# Patient Record
Sex: Male | Born: 1960 | Race: White | Hispanic: No | Smoking: Never smoker
Health system: Southern US, Community
[De-identification: ages and names within clinical notes are randomized; demographics above are authoritative.]

## PROBLEM LIST (undated history)

## (undated) DIAGNOSIS — I639 Cerebral infarction, unspecified: Secondary | ICD-10-CM

## (undated) DIAGNOSIS — R1310 Dysphagia, unspecified: Secondary | ICD-10-CM

## (undated) DIAGNOSIS — E7800 Pure hypercholesterolemia, unspecified: Secondary | ICD-10-CM

## (undated) DIAGNOSIS — I509 Heart failure, unspecified: Secondary | ICD-10-CM

## (undated) DIAGNOSIS — I10 Essential (primary) hypertension: Secondary | ICD-10-CM

## (undated) DIAGNOSIS — N289 Disorder of kidney and ureter, unspecified: Secondary | ICD-10-CM

## (undated) DIAGNOSIS — E119 Type 2 diabetes mellitus without complications: Secondary | ICD-10-CM

## (undated) HISTORY — PX: PEG TUBE PLACEMENT: SUR1034

---

## 2016-11-25 ENCOUNTER — Emergency Department (HOSPITAL_COMMUNITY)
Admission: EM | Admit: 2016-11-25 | Discharge: 2016-11-25 | Disposition: A | Discharge status: Home / Self Care | Payer: Medicaid Other | Attending: Emergency Medicine | Admitting: Emergency Medicine

## 2016-11-25 ENCOUNTER — Encounter (HOSPITAL_COMMUNITY): Payer: Self-pay | Admitting: Emergency Medicine

## 2016-11-25 DIAGNOSIS — I509 Heart failure, unspecified: Secondary | ICD-10-CM | POA: Insufficient documentation

## 2016-11-25 DIAGNOSIS — Z79899 Other long term (current) drug therapy: Secondary | ICD-10-CM | POA: Insufficient documentation

## 2016-11-25 DIAGNOSIS — I110 Hypertensive heart disease with heart failure: Secondary | ICD-10-CM | POA: Insufficient documentation

## 2016-11-25 DIAGNOSIS — J069 Acute upper respiratory infection, unspecified: Secondary | ICD-10-CM

## 2016-11-25 DIAGNOSIS — R0981 Nasal congestion: Secondary | ICD-10-CM

## 2016-11-25 DIAGNOSIS — Z7982 Long term (current) use of aspirin: Secondary | ICD-10-CM | POA: Insufficient documentation

## 2016-11-25 DIAGNOSIS — E119 Type 2 diabetes mellitus without complications: Secondary | ICD-10-CM | POA: Insufficient documentation

## 2016-11-25 HISTORY — DX: Pure hypercholesterolemia, unspecified: E78.00

## 2016-11-25 HISTORY — DX: Disorder of kidney and ureter, unspecified: N28.9

## 2016-11-25 HISTORY — DX: Cerebral infarction, unspecified: I63.9

## 2016-11-25 HISTORY — DX: Heart failure, unspecified: I50.9

## 2016-11-25 HISTORY — DX: Essential (primary) hypertension: I10

## 2016-11-25 HISTORY — DX: Type 2 diabetes mellitus without complications: E11.9

## 2016-11-25 MED ORDER — FLUTICASONE PROPIONATE 50 MCG/ACT NA SUSP: 2.0000 | Freq: Every day | NASAL | 0 refills | Status: DC

## 2016-11-25 MED ORDER — CETIRIZINE-PSEUDOEPHEDRINE ER 5-120 MG PO TB12: 1.0000 | ORAL_TABLET | Freq: Every day | ORAL | 0 refills | Status: DC

## 2016-11-25 NOTE — ED Notes (Signed)
Pt and family updated on plan of care,  

## 2016-11-25 NOTE — ED Triage Notes (Signed)
Caregiver states the pt has been short of breath that started today.  Pt is not in any acute distress, resp even and non labored.  Pt states his nose is stopped up and is causing him to have a hard time breathing.

## 2016-11-25 NOTE — ED Notes (Signed)
ED Provider at bedside. 

## 2016-11-25 NOTE — ED Provider Notes (Signed)
[facility]-EM[facility]Y DEPT Provider Note   CSN: [id number] Arrival date & time: 11/25/16  2039   By signing my name below, I, Ronald Ortiz, attest that this documentation has been prepared under the direction and in the presence of [name], MD  Electronically Signed: Clovis Ortiz, ED Scribe. 11/25/16. 11:17 PM.   History   Chief Complaint Chief Complaint  Patient presents with  . Nasal Congestion   The history is provided by a caregiver. No language interpreter was used.   HPI Comments:  Ronald Ortiz is a 56 y.o. male, with a PMHx of CHF, FM, HTN, renal disorder and stroke on 10/19/16, who presents to the Emergency Department complaining of acute onset, gradually worsening sensation of SOB x yesterday. Caregiver also reports nasal congestion which appears clear and mild nose bleeding. His sensation of SOB is worse when laying flat at night. Pt has used nasal spray with no relief. The caregiver has also tried to use a nasal bulb to pump mucus out of the pt's nose with no relief. Caregiver denies fever or any other associated symptoms. No other complaints noted.  No cough or fevers  Past Medical History:  Diagnosis Date  . CHF (congestive heart failure) (HCC)   . Diabetes mellitus without complication (HCC)   . Hypercholesteremia   . Hypertension   . Renal disorder   . Stroke [facility])    feb 14th    There are no active problems to display for this patient.   Past Surgical History:  Procedure Laterality Date  . PEG TUBE PLACEMENT      Home Medications    Prior to Admission medications   Medication Sig Start Date End Date Taking? Authorizing Provider  amLODipine (NORVASC) 10 MG tablet Place 10 mg into feeding tube daily.   Yes Historical Provider, MD  aspirin 325 MG tablet Place 325 mg into feeding tube daily.   Yes Historical Provider, MD  atorvastatin (LIPITOR) 80 MG tablet Place 80 mg into feeding tube at bedtime.   Yes Historical Provider, MD  clopidogrel (PLAVIX) 75 MG  tablet Place 75 mg into feeding tube daily.   Yes Historical Provider, MD  docusate sodium (COLACE) 100 MG capsule 100 mg 2 (two) times daily as needed for mild constipation. Pt takes via peg tube.   Yes Historical Provider, MD  labetalol (NORMODYNE) 200 MG tablet Place 400 mg into feeding tube 2 (two) times daily.   Yes Historical Provider, MD  metoCLOPramide (REGLAN) 5 MG/5ML solution Place 5 mg into feeding tube 4 (four) times daily.   Yes Historical Provider, MD  phenylephrine (NEO-SYNEPHRINE) 0.5 % nasal solution Place 1 drop into both nostrils every 8 (eight) hours as needed (for epistaxis).   Yes Historical Provider, MD  polyvinyl alcohol (LIQUIFILM TEARS) 1.4 % ophthalmic solution Place 1 drop into both eyes every 6 (six) hours.   Yes Historical Provider, MD  potassium chloride (KLOR-CON) 20 MEQ packet Place 20 mEq into feeding tube daily.   Yes Historical Provider, MD  predniSONE (DELTASONE) 20 MG tablet Place 20 mg into feeding tube daily with breakfast.   Yes Historical Provider, MD  zolpidem (AMBIEN) 5 MG tablet Place 5 mg into feeding tube at bedtime as needed for sleep.   Yes Historical Provider, MD  cetirizine-pseudoephedrine (ZYRTEC-D) 5-120 MG tablet Take 1 tablet by mouth daily. 11/25/16   [name], MD  fluticasone (FLONASE) 50 MCG/ACT nasal spray Place 2 sprays into both nostrils daily. 11/25/16 12/02/16  [name], MD  Family History No family history on file.  Social History Social History  Substance Use Topics  . Smoking status: Never Smoker  . Smokeless tobacco: Never Used  . Alcohol use No     Allergies   Patient has no known allergies.   Review of Systems Review of Systems  Constitutional: Negative for fever.  HENT: Positive for congestion.   Respiratory: Positive for shortness of breath.   All other systems reviewed and are negative.    Physical Exam Updated Vital Signs BP 117/87   Pulse 80   Temp 98.1 F (36.7 C) (Oral)   Resp 16   Ht  6' (1.829 m)   Wt 165 lb (74.8 kg)   SpO2 100%   BMI 22.38 kg/m   Physical Exam  Constitutional: He appears well-developed and well-nourished.  HENT:  Head: Normocephalic and atraumatic.  Right Ear: External ear normal.  Left Ear: External ear normal.  Nose: Nose normal.  Bilateral nares are without significant congestion at this time. Mild areas of scabs to the right nare without active bleeding.   Eyes: Right eye exhibits no discharge. Left eye exhibits no discharge.  Chronic right eye changes  Neck: Neck supple.  Cardiovascular: Normal rate, regular rhythm and normal heart sounds.   Pulmonary/Chest: Effort normal and breath sounds normal. He has no wheezes. He has no rales.  Musculoskeletal: He exhibits no edema.  Neurological: He is alert.  Skin: Skin is warm and dry.  Nursing note and vitals reviewed.  ED Treatments / Results  DIAGNOSTIC STUDIES:  Oxygen Saturation is 100% on RA, normal by my interpretation.    COORDINATION OF CARE:  11:10 PM Discussed treatment plan with pt at bedside and pt agreed to plan.  Labs (all labs ordered are listed, but only abnormal results are displayed) Labs Reviewed - No data to display  EKG  EKG Interpretation None       Radiology No results found.  Procedures Procedures (including critical care time)  Medications Ordered in ED Medications - No data to display   Initial Impression / Assessment and Plan / ED Course  I have reviewed the triage vital signs and the nursing notes.  Pertinent labs & imaging results that were available during my care of the patient were reviewed by me and considered in my medical decision making (see chart for details).     Patient's chief complaint is with nasal congestion. He has no increased work of breathing. He has "trouble breathing" that is really trouble breathing through his nose only. No cough. Lungs are clear. Discussed over-the-counter remedies as well as will prescribe  antihistamines. Discussed return precautions. Overall appears at baseline. Advised to sleep more upright to help with difficulty breathing at night due to the congestion.  Final Clinical Impressions(s) / ED Diagnoses   Final diagnoses:  Nasal congestion  Viral upper respiratory tract infection    New Prescriptions Discharge Medication List as of 11/25/2016 11:30 PM    START taking these medications   Details  cetirizine-pseudoephedrine (ZYRTEC-D) 5-120 MG tablet Take 1 tablet by mouth daily., Starting Fri 11/25/2016, Print    fluticasone (FLONASE) 50 MCG/ACT nasal spray Place 2 sprays into both nostrils daily., Starting Fri 11/25/2016, Until Fri 12/02/2016, Print       I personally performed the services described in this documentation, which was scribed in my presence. The recorded information has been reviewed and is accurate.     Pricilla LovelessScott Benaiah Behan, MD 11/26/16 [id number]

## 2016-11-25 NOTE — ED Triage Notes (Signed)
[place]- now cared for by his brother-   Pt has no physician  He complains of dyspnea x 24 hours

## 2016-11-25 NOTE — ED Notes (Signed)
Pt c/o hard time breathing through the right nare, pt and brother report that symptoms started last night, denies any cough, denies any problems with aspiration, pt has hx of dysphagia due to previous stroke,

## 2016-11-29 ENCOUNTER — Emergency Department (HOSPITAL_COMMUNITY): Payer: Medicaid Other

## 2016-11-29 ENCOUNTER — Emergency Department (HOSPITAL_COMMUNITY)
Admission: EM | Admit: 2016-11-29 | Discharge: 2016-11-29 | Disposition: A | Discharge status: Home / Self Care | Payer: Medicaid Other | Attending: Emergency Medicine | Admitting: Emergency Medicine

## 2016-11-29 ENCOUNTER — Encounter (HOSPITAL_COMMUNITY): Payer: Self-pay | Admitting: Emergency Medicine

## 2016-11-29 DIAGNOSIS — Z79899 Other long term (current) drug therapy: Secondary | ICD-10-CM | POA: Diagnosis not present

## 2016-11-29 DIAGNOSIS — I110 Hypertensive heart disease with heart failure: Secondary | ICD-10-CM | POA: Diagnosis not present

## 2016-11-29 DIAGNOSIS — Z7982 Long term (current) use of aspirin: Secondary | ICD-10-CM | POA: Diagnosis not present

## 2016-11-29 DIAGNOSIS — E119 Type 2 diabetes mellitus without complications: Secondary | ICD-10-CM | POA: Diagnosis not present

## 2016-11-29 DIAGNOSIS — R410 Disorientation, unspecified: Secondary | ICD-10-CM | POA: Diagnosis not present

## 2016-11-29 DIAGNOSIS — I509 Heart failure, unspecified: Secondary | ICD-10-CM | POA: Insufficient documentation

## 2016-11-29 DIAGNOSIS — R4182 Altered mental status, unspecified: Secondary | ICD-10-CM | POA: Diagnosis present

## 2016-11-29 LAB — LIPASE, BLOOD: LIPASE: 26 U/L (ref 11–51)

## 2016-11-29 LAB — DIFFERENTIAL
Basophils Absolute: 0 10*3/uL (ref 0.0–0.1)
Basophils Relative: 0 %
EOS ABS: 0.2 10*3/uL (ref 0.0–0.7)
Eosinophils Relative: 4 %
Lymphocytes Relative: 17 %
Lymphs Abs: 0.8 10*3/uL (ref 0.7–4.0)
Monocytes Absolute: 0.6 10*3/uL (ref 0.1–1.0)
Monocytes Relative: 14 %
NEUTROS ABS: 2.8 10*3/uL (ref 1.7–7.7)
Neutrophils Relative %: 65 %

## 2016-11-29 LAB — URINALYSIS, ROUTINE W REFLEX MICROSCOPIC
Bilirubin Urine: NEGATIVE
GLUCOSE, UA: NEGATIVE mg/dL
Hgb urine dipstick: NEGATIVE
Ketones, ur: NEGATIVE mg/dL
LEUKOCYTES UA: NEGATIVE
Nitrite: NEGATIVE
PROTEIN: NEGATIVE mg/dL
Specific Gravity, Urine: 1.012 (ref 1.005–1.030)
pH: 6 (ref 5.0–8.0)

## 2016-11-29 LAB — CBC
HCT: 35.1 % — ABNORMAL LOW (ref 39.0–52.0)
Hemoglobin: 12 g/dL — ABNORMAL LOW (ref 13.0–17.0)
MCH: 32.3 pg (ref 26.0–34.0)
MCHC: 34.2 g/dL (ref 30.0–36.0)
MCV: 94.6 fL (ref 78.0–100.0)
PLATELETS: 256 10*3/uL (ref 150–400)
RBC: 3.71 MIL/uL — AB (ref 4.22–5.81)
RDW: 13.9 % (ref 11.5–15.5)
WBC: 4.6 10*3/uL (ref 4.0–10.5)

## 2016-11-29 LAB — COMPREHENSIVE METABOLIC PANEL
ALT: 61 U/L (ref 17–63)
AST: 54 U/L — ABNORMAL HIGH (ref 15–41)
Albumin: 3.7 g/dL (ref 3.5–5.0)
Alkaline Phosphatase: 90 U/L (ref 38–126)
Anion gap: 9 (ref 5–15)
BUN: 24 mg/dL — ABNORMAL HIGH (ref 6–20)
CHLORIDE: 99 mmol/L — AB (ref 101–111)
CO2: 29 mmol/L (ref 22–32)
CREATININE: 1.29 mg/dL — AB (ref 0.61–1.24)
Calcium: 9.8 mg/dL (ref 8.9–10.3)
Glucose, Bld: 201 mg/dL — ABNORMAL HIGH (ref 65–99)
POTASSIUM: 4.1 mmol/L (ref 3.5–5.1)
Sodium: 137 mmol/L (ref 135–145)
Total Bilirubin: 0.8 mg/dL (ref 0.3–1.2)
Total Protein: 7.6 g/dL (ref 6.5–8.1)

## 2016-11-29 LAB — TROPONIN I

## 2016-11-29 LAB — BRAIN NATRIURETIC PEPTIDE: B NATRIURETIC PEPTIDE 5: 12 pg/mL (ref 0.0–100.0)

## 2016-11-29 NOTE — ED Provider Notes (Signed)
[facility] DE[facility] Provider Note   CSN: [id number] Arrival date & time: 11/29/16  1244     History   Chief Complaint Chief Complaint  Patient presents with  . Altered Mental Status    HPI Ronald Ortiz is a 56 y.o. male.  The history is provided by a relative and a caregiver. The history is limited by the condition of the patient (non-verbal).  Altered Mental Status      Pt was seen at 1340.  Per pt's family: Pt's family states pt has "been acting confused" since yesterday. Has been associated with generalized weakness, coughing and "gagging like he's going to throw up." Pt's family states pt has had nose bleeds, but then state pt's nosebleeds start "after he shoves his fingers up his nose" and "he picks his nose a lot." Pt was evaluated in the ED 4 days ago for SOB and nasal congestion, was rx nasal sprays but did not fill the prescriptions. Caregivers deny pt has fallen, no vomiting/diarrhea, no fevers. Pt has hx of recent CVA and is non-verbal, mild left sided weakness, unable to stand without significant assist, and bed bound at baseline. Family states they "just picked him up" from [facility] in [place].   Past Medical History:  Diagnosis Date  . CHF (congestive heart failure) (HCC)   . Diabetes mellitus without complication (HCC)   . Hypercholesteremia   . Hypertension   . Renal disorder   . Stroke [facility])    feb 14th    There are no active problems to display for this patient.   Past Surgical History:  Procedure Laterality Date  . PEG TUBE PLACEMENT         Home Medications    Prior to Admission medications   Medication Sig Start Date End Date Taking? Authorizing Provider  amLODipine (NORVASC) 10 MG tablet Place 10 mg into feeding tube daily.   Yes Historical Provider, MD  aspirin 325 MG tablet Place 325 mg into feeding tube daily.   Yes Historical Provider, MD  atorvastatin (LIPITOR) 80 MG tablet Place 80 mg into feeding tube at bedtime.    Yes Historical Provider, MD  cetirizine-pseudoephedrine (ZYRTEC-D) 5-120 MG tablet Take 1 tablet by mouth daily. 11/25/16  Yes [name], MD  clopidogrel (PLAVIX) 75 MG tablet Place 75 mg into feeding tube daily.   Yes Historical Provider, MD  docusate sodium (COLACE) 100 MG capsule 100 mg 2 (two) times daily as needed for mild constipation. Pt takes via peg tube.   Yes Historical Provider, MD  fluticasone (FLONASE) 50 MCG/ACT nasal spray Place 2 sprays into both nostrils daily. 11/25/16 12/02/16 Yes [name], MD  labetalol (NORMODYNE) 200 MG tablet Place 400 mg into feeding tube 2 (two) times daily.   Yes Historical Provider, MD  metoCLOPramide (REGLAN) 5 MG/5ML solution Place 5 mg into feeding tube 4 (four) times daily.   Yes Historical Provider, MD  polyvinyl alcohol (LIQUIFILM TEARS) 1.4 % ophthalmic solution Place 1 drop into both eyes every 6 (six) hours.   Yes Historical Provider, MD  potassium chloride (KLOR-CON) 20 MEQ packet Place 20 mEq into feeding tube daily.   Yes Historical Provider, MD  zolpidem (AMBIEN) 5 MG tablet Place 5 mg into feeding tube at bedtime as needed for sleep.   Yes Historical Provider, MD  phenylephrine (NEO-SYNEPHRINE) 0.5 % nasal solution Place 1 drop into both nostrils every 8 (eight) hours as needed (for epistaxis).    Historical Provider, MD  predniSONE (DELTASONE)  20 MG tablet Place 20 mg into feeding tube daily with breakfast.    Historical Provider, MD    Family History History reviewed. No pertinent family history.  Social History Social History  Substance Use Topics  . Smoking status: Never Smoker  . Smokeless tobacco: Never Used  . Alcohol use No     Allergies   Patient has no known allergies.   Review of Systems Review of Systems  Unable to perform ROS: Patient nonverbal     Physical Exam Updated Vital Signs BP 125/85 (BP Location: Left Arm)   Pulse 80   Temp 98.5 F (36.9 C)   Resp 20   Ht 6' (1.829 m)   Wt 165 lb (74.8  kg)   SpO2 98%   BMI 22.38 kg/m   Physical Exam 1345: Physical examination:  Nursing notes reviewed; Vital signs and O2 SAT reviewed;  Constitutional: Well developed, Well nourished, Well hydrated, In no acute distress; Head:  Normocephalic, atraumatic; Eyes: Chronic right eye changes. Otherwise, EOMI, No scleral icterus; ENMT: +dried blood right nares, no active bleeding. Mouth and pharynx normal, Mucous membranes moist; Neck: Supple, Full range of motion, No lymphadenopathy; Cardiovascular: Regular rate and rhythm, No gallop; Respiratory: Breath sounds clear & equal bilaterally, No wheezes. Normal respiratory effort/excursion; Chest: Nontender, Movement normal; Abdomen: Soft, Nontender, Nondistended, Normal bowel sounds. +PEG tube in place, no drainage, no erythema.; Genitourinary: No CVA tenderness; Extremities: Pulses normal, No deformity. No edema, No calf edema or asymmetry.; Neuro: Awake, alert. Non-verbal and mild left sided weakness per hx..; Skin: Color normal, Warm, Dry.   ED Treatments / Results  Labs (all labs ordered are listed, but only abnormal results are displayed)   EKG  EKG Interpretation  Date/Time:  Tuesday November 29 2016 13:33:22 EDT Ventricular Rate:  83 PR Interval:    QRS Duration: 106 QT Interval:  393 QTC Calculation: 462 R Axis:   81 Text Interpretation:  Sinus rhythm Baseline wander in lead(s) V2 V3 V4 V5 No old tracing to compare Confirmed by KNAPP  MD-J, JON ([id number]) on 11/29/2016 1:41:23 PM       Radiology   Procedures Procedures (including critical care time)  Medications Ordered in ED Medications - No data to display   Initial Impression / Assessment and Plan / ED Course  I have reviewed the triage vital signs and the nursing notes.  Pertinent labs & imaging results that were available during my care of the patient were reviewed by me and considered in my medical decision making (see chart for details).  MDM Reviewed: nursing note and  vitals Interpretation: labs, ECG, x-ray and CT scan   Results for orders placed or performed during the hospital encounter of 11/29/16  Comprehensive metabolic panel  Result Value Ref Range   Sodium 137 135 - 145 mmol/L   Potassium 4.1 3.5 - 5.1 mmol/L   Chloride 99 (L) 101 - 111 mmol/L   CO2 29 22 - 32 mmol/L   Glucose, Bld 201 (H) 65 - 99 mg/dL   BUN 24 (H) 6 - 20 mg/dL   Creatinine, Ser 1.32 (H) 0.61 - 1.24 mg/dL   Calcium 9.8 8.9 - 44.0 mg/dL   Total Protein 7.6 6.5 - 8.1 g/dL   Albumin 3.7 3.5 - 5.0 g/dL   AST 54 (H) 15 - 41 U/L   ALT 61 17 - 63 U/L   Alkaline Phosphatase 90 38 - 126 U/L   Total Bilirubin 0.8 0.3 - 1.2 mg/dL   GFR  calc non Af Amer >60 >60 mL/min   GFR calc Af Amer >60 >60 mL/min   Anion gap 9 5 - 15  CBC  Result Value Ref Range   WBC 4.6 4.0 - 10.5 K/uL   RBC 3.71 (L) 4.22 - 5.81 MIL/uL   Hemoglobin 12.0 (L) 13.0 - 17.0 g/dL   HCT 16.135.1 (L) 09.639.0 - 04.552.0 %   MCV 94.6 78.0 - 100.0 fL   MCH 32.3 26.0 - 34.0 pg   MCHC 34.2 30.0 - 36.0 g/dL   RDW 40.913.9 81.111.5 - 91.415.5 %   Platelets 256 150 - 400 K/uL  Urinalysis, Routine w reflex microscopic  Result Value Ref Range   Color, Urine YELLOW YELLOW   APPearance CLEAR CLEAR   Specific Gravity, Urine 1.012 1.005 - 1.030   pH 6.0 5.0 - 8.0   Glucose, UA NEGATIVE NEGATIVE mg/dL   Hgb urine dipstick NEGATIVE NEGATIVE   Bilirubin Urine NEGATIVE NEGATIVE   Ketones, ur NEGATIVE NEGATIVE mg/dL   Protein, ur NEGATIVE NEGATIVE mg/dL   Nitrite NEGATIVE NEGATIVE   Leukocytes, UA NEGATIVE NEGATIVE  Lipase, blood  Result Value Ref Range   Lipase 26 11 - 51 U/L  Troponin I  Result Value Ref Range   Troponin I <0.03 <0.03 ng/mL  Brain natriuretic peptide  Result Value Ref Range   B Natriuretic Peptide 12.0 0.0 - 100.0 pg/mL  Differential  Result Value Ref Range   Neutrophils Relative % 65 %   Neutro Abs 2.8 1.7 - 7.7 K/uL   Lymphocytes Relative 17 %   Lymphs Abs 0.8 0.7 - 4.0 K/uL   Monocytes Relative 14 %    Monocytes Absolute 0.6 0.1 - 1.0 K/uL   Eosinophils Relative 4 %   Eosinophils Absolute 0.2 0.0 - 0.7 K/uL   Basophils Relative 0 %   Basophils Absolute 0.0 0.0 - 0.1 K/uL   Dg Abd Acute W/chest Result Date: 11/29/2016 CLINICAL DATA:  Cough and congestion EXAM: DG ABDOMEN ACUTE W/ 1V CHEST COMPARISON:  None. FINDINGS: Cardiac shadow is at the upper limits of normal in size. The lungs are well aerated bilaterally. Gastrostomy catheter is noted in the left upper quadrant. Scattered large and small bowel gas is noted. Degenerative changes of the lumbar spine are seen. No abnormal calcifications are noted. IMPRESSION: No acute abnormality noted. Electronically Signed   By: Alcide CleverMark  Lukens M.D.   On: 11/29/2016 14:45   Ct Head Wo Contrast Result Date: 11/29/2016 CLINICAL DATA:  Generalized weakness and lethargy. EXAM: CT HEAD WITHOUT CONTRAST TECHNIQUE: Contiguous axial images were obtained from the base of the skull through the vertex without intravenous contrast. COMPARISON:  None. FINDINGS: Brain: No acute intracranial hemorrhage. No focal mass lesion. No CT evidence of acute infarction. No midline shift or mass effect. No hydrocephalus. Basilar cisterns are patent. Deep white matter infarction on the RIGHT centrum semiovale and in the bilateral frontal lobes. Additional remote infarction in the RIGHT thalamus. Vascular: No hyperdense vessel or unexpected calcification. Skull: Normal. Negative for fracture or focal lesion. Sinuses/Orbits: Paranasal sinuses and mastoid air cells are clear. Orbits are clear. Other: None. IMPRESSION: 1. No acute intracranial findings. 2. Deep white matter infarctions. Electronically Signed   By: Genevive BiStewart  Edmunds M.D.   On: 11/29/2016 15:26    1545:  Pt's family seem overwhelmed with amount of care pt requires.  [facility], case discussed, including:  HPI, pertinent PM/SHx, VS/PE, dx testing, ED course and treatment:  Agreeable to come to  ED to speak with family. MRI  brain pending to r/o new stroke as cause for symptoms. Otherwise, no clear indication for admission at this time. Sign out to Dr. Rubin Payor.    Final Clinical Impressions(s) / ED Diagnoses   Final diagnoses:  None    New Prescriptions New Prescriptions   No medications on file      Samuel Jester, DO 11/29/16 1546

## 2016-11-29 NOTE — ED Notes (Signed)
Unable to do orthostatics.  Pt unable to stand per sister.

## 2016-11-29 NOTE — Discharge Instructions (Signed)
Follow up with the resources and with his new primary care doctor.

## 2016-11-29 NOTE — ED Triage Notes (Signed)
PT Brother brought pt in today because they noticed increased generalized weakness with lethargy at home starting yesterday morning. PT is non-verbal and has a peg-tube and is NPO d/t old Strokes. PT c/o nausea and upper abdominal discomfort, nasal congestion, mucous around peg tube insertion. Last BM 11/28/16. PT brother states pt was seen in ED 11/25/16 and they were unable to get the prescriptions filled d/t financial issues and pt does not have insurance yet.

## 2016-11-29 NOTE — Care Management (Signed)
CM consult received for resources. 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 His brother Vincenza Hews(Shane) and sister in law have been his primary caregivers. He uses a WC for mobility and has PEG tub for nutrition. Vincenza HewsShane and his wife state that physically they can care for him but financially they are struggling with price of tube feed and meds. Vincenza HewsShane has applied for medicaid last week and is waiting to hear back. He has applied for disability. His brother has [facility] and his first appointment is on 12/16/16. Pt given contact info fro [facility] for further community resources. They have declined HH or [facility].

## 2016-11-29 NOTE — ED Provider Notes (Signed)
  Physical Exam  BP 130/82   Pulse 83   Temp 98.5 F (36.9 C)   Resp 15   Ht 6' (1.829 m)   Wt 165 lb (74.8 kg)   SpO2 98%   BMI 22.38 kg/m   Physical Exam  ED Course  Procedures  MDM MRI reviewed. No acute changes. Discussed with patient and family. Will discharge home. Has been seen by case management.       Ronald CoreNathan Newell Frater, MD 11/29/16 [phone]

## 2016-11-30 LAB — URINE CULTURE: CULTURE: NO GROWTH

## 2017-02-15 ENCOUNTER — Inpatient Hospital Stay (HOSPITAL_COMMUNITY)
Admission: EM | Admit: 2017-02-15 | Discharge: 2017-02-18 | DRG: 640 | Disposition: A | Discharge status: Skilled Nursing Facility | Payer: Medicaid Other | Attending: Internal Medicine | Admitting: Internal Medicine

## 2017-02-15 ENCOUNTER — Encounter (HOSPITAL_COMMUNITY): Payer: Self-pay

## 2017-02-15 DIAGNOSIS — Z7902 Long term (current) use of antithrombotics/antiplatelets: Secondary | ICD-10-CM

## 2017-02-15 DIAGNOSIS — Z7189 Other specified counseling: Secondary | ICD-10-CM

## 2017-02-15 DIAGNOSIS — I509 Heart failure, unspecified: Secondary | ICD-10-CM | POA: Diagnosis present

## 2017-02-15 DIAGNOSIS — E860 Dehydration: Secondary | ICD-10-CM

## 2017-02-15 DIAGNOSIS — R5381 Other malaise: Secondary | ICD-10-CM

## 2017-02-15 DIAGNOSIS — E876 Hypokalemia: Secondary | ICD-10-CM | POA: Diagnosis present

## 2017-02-15 DIAGNOSIS — E7800 Pure hypercholesterolemia, unspecified: Secondary | ICD-10-CM | POA: Diagnosis present

## 2017-02-15 DIAGNOSIS — I110 Hypertensive heart disease with heart failure: Secondary | ICD-10-CM | POA: Diagnosis present

## 2017-02-15 DIAGNOSIS — I639 Cerebral infarction, unspecified: Secondary | ICD-10-CM | POA: Diagnosis present

## 2017-02-15 DIAGNOSIS — R824 Acetonuria: Secondary | ICD-10-CM | POA: Diagnosis present

## 2017-02-15 DIAGNOSIS — Z681 Body mass index (BMI) 19 or less, adult: Secondary | ICD-10-CM

## 2017-02-15 DIAGNOSIS — R000 Tachycardia, unspecified: Secondary | ICD-10-CM | POA: Diagnosis present

## 2017-02-15 DIAGNOSIS — Z7982 Long term (current) use of aspirin: Secondary | ICD-10-CM

## 2017-02-15 DIAGNOSIS — I69354 Hemiplegia and hemiparesis following cerebral infarction affecting left non-dominant side: Secondary | ICD-10-CM

## 2017-02-15 DIAGNOSIS — R627 Adult failure to thrive: Principal | ICD-10-CM

## 2017-02-15 DIAGNOSIS — Z7401 Bed confinement status: Secondary | ICD-10-CM

## 2017-02-15 DIAGNOSIS — E43 Unspecified severe protein-calorie malnutrition: Secondary | ICD-10-CM | POA: Diagnosis present

## 2017-02-15 DIAGNOSIS — I10 Essential (primary) hypertension: Secondary | ICD-10-CM | POA: Diagnosis present

## 2017-02-15 DIAGNOSIS — R54 Age-related physical debility: Secondary | ICD-10-CM | POA: Diagnosis present

## 2017-02-15 DIAGNOSIS — R64 Cachexia: Secondary | ICD-10-CM | POA: Diagnosis present

## 2017-02-15 DIAGNOSIS — Z515 Encounter for palliative care: Secondary | ICD-10-CM | POA: Diagnosis present

## 2017-02-15 DIAGNOSIS — Z79899 Other long term (current) drug therapy: Secondary | ICD-10-CM

## 2017-02-15 DIAGNOSIS — R1310 Dysphagia, unspecified: Secondary | ICD-10-CM | POA: Diagnosis present

## 2017-02-15 DIAGNOSIS — E869 Volume depletion, unspecified: Secondary | ICD-10-CM | POA: Diagnosis present

## 2017-02-15 DIAGNOSIS — H5440 Blindness, one eye, unspecified eye: Secondary | ICD-10-CM | POA: Diagnosis present

## 2017-02-15 DIAGNOSIS — R4702 Dysphasia: Secondary | ICD-10-CM | POA: Diagnosis present

## 2017-02-15 DIAGNOSIS — E119 Type 2 diabetes mellitus without complications: Secondary | ICD-10-CM | POA: Diagnosis present

## 2017-02-15 DIAGNOSIS — Z931 Gastrostomy status: Secondary | ICD-10-CM

## 2017-02-15 LAB — URINALYSIS, ROUTINE W REFLEX MICROSCOPIC
Bilirubin Urine: NEGATIVE
Glucose, UA: NEGATIVE mg/dL
Hgb urine dipstick: NEGATIVE
Ketones, ur: 20 mg/dL — AB
Leukocytes, UA: NEGATIVE
Nitrite: NEGATIVE
Protein, ur: NEGATIVE mg/dL
Specific Gravity, Urine: 1.025 (ref 1.005–1.030)
pH: 5 (ref 5.0–8.0)

## 2017-02-15 LAB — CBC WITH DIFFERENTIAL/PLATELET
Basophils Absolute: 0 10*3/uL (ref 0.0–0.1)
Basophils Relative: 0 %
Eosinophils Absolute: 0 10*3/uL (ref 0.0–0.7)
Eosinophils Relative: 0 %
HCT: 38.4 % — ABNORMAL LOW (ref 39.0–52.0)
Hemoglobin: 12.7 g/dL — ABNORMAL LOW (ref 13.0–17.0)
Lymphocytes Relative: 8 %
Lymphs Abs: 0.5 10*3/uL — ABNORMAL LOW (ref 0.7–4.0)
MCH: 30.5 pg (ref 26.0–34.0)
MCHC: 33.1 g/dL (ref 30.0–36.0)
MCV: 92.3 fL (ref 78.0–100.0)
Monocytes Absolute: 0.7 10*3/uL (ref 0.1–1.0)
Monocytes Relative: 11 %
Neutro Abs: 5.2 10*3/uL (ref 1.7–7.7)
Neutrophils Relative %: 81 %
Platelets: 288 10*3/uL (ref 150–400)
RBC: 4.16 MIL/uL — ABNORMAL LOW (ref 4.22–5.81)
RDW: 13.2 % (ref 11.5–15.5)
WBC: 6.5 10*3/uL (ref 4.0–10.5)

## 2017-02-15 LAB — BASIC METABOLIC PANEL
Anion gap: 11 (ref 5–15)
BUN: 55 mg/dL — ABNORMAL HIGH (ref 6–20)
CO2: 29 mmol/L (ref 22–32)
Calcium: 10.3 mg/dL (ref 8.9–10.3)
Chloride: 101 mmol/L (ref 101–111)
Creatinine, Ser: 1.35 mg/dL — ABNORMAL HIGH (ref 0.61–1.24)
GFR calc Af Amer: >60 mL/min (ref >60)
GFR calc non Af Amer: 58 mL/min — ABNORMAL LOW (ref >60)
Glucose, Bld: 163 mg/dL — ABNORMAL HIGH (ref 65–99)
Potassium: 4.2 mmol/L (ref 3.5–5.1)
Sodium: 141 mmol/L (ref 135–145)

## 2017-02-15 MED ORDER — GLUCERNA SHAKE PO LIQD: 237.0000 mL | Freq: Three times a day (TID) | ORAL | Status: DC

## 2017-02-15 MED ORDER — AMLODIPINE BESYLATE 5 MG PO TABS
10.0000 mg | ORAL_TABLET | Freq: Every day | ORAL | Status: DC
  Administered 2017-02-16 – 2017-02-18 (×3): 10 mg
  Filled 2017-02-15 (×3): qty 2

## 2017-02-15 MED ORDER — HEPARIN SODIUM (PORCINE) 5000 UNIT/ML IJ SOLN
5000.0000 [IU] | Freq: Three times a day (TID) | INTRAMUSCULAR | Status: DC
  Administered 2017-02-15 – 2017-02-17 (×7): 5000 [IU] via SUBCUTANEOUS
  Filled 2017-02-15 (×7): qty 1

## 2017-02-15 MED ORDER — MIRTAZAPINE 15 MG PO TABS
15.0000 mg | ORAL_TABLET | Freq: Every day | ORAL | Status: DC
  Administered 2017-02-15: 15 mg via ORAL
  Filled 2017-02-15: qty 1

## 2017-02-15 MED ORDER — POLYVINYL ALCOHOL 1.4 % OP SOLN
1.0000 [drp] | Freq: Four times a day (QID) | OPHTHALMIC | Status: DC
  Administered 2017-02-15 – 2017-02-18 (×10): 1 [drp] via OPHTHALMIC
  Filled 2017-02-15 (×3): qty 15

## 2017-02-15 MED ORDER — PHENYLEPHRINE HCL 0.5 % NA SOLN
1.0000 [drp] | Freq: Three times a day (TID) | NASAL | Status: DC | PRN
  Filled 2017-02-15: qty 15

## 2017-02-15 MED ORDER — CHLORHEXIDINE GLUCONATE 0.12 % MT SOLN
15.0000 mL | Freq: Two times a day (BID) | OROMUCOSAL | Status: DC
  Administered 2017-02-15 – 2017-02-18 (×6): 15 mL via OROMUCOSAL
  Filled 2017-02-15 (×6): qty 15

## 2017-02-15 MED ORDER — ORAL CARE MOUTH RINSE
15.0000 mL | Freq: Two times a day (BID) | OROMUCOSAL | Status: DC
  Administered 2017-02-16 – 2017-02-17 (×3): 15 mL via OROMUCOSAL

## 2017-02-15 MED ORDER — CLOPIDOGREL BISULFATE 75 MG PO TABS
75.0000 mg | ORAL_TABLET | Freq: Every day | ORAL | Status: DC
  Administered 2017-02-16 – 2017-02-18 (×3): 75 mg
  Filled 2017-02-15 (×3): qty 1

## 2017-02-15 MED ORDER — PNEUMOCOCCAL VAC POLYVALENT 25 MCG/0.5ML IJ INJ: 0.5000 mL | INJECTION | INTRAMUSCULAR | Status: DC

## 2017-02-15 MED ORDER — SODIUM CHLORIDE 0.9 % IV BOLUS (SEPSIS)
1000.0000 mL | Freq: Once | INTRAVENOUS | Status: AC
  Administered 2017-02-15: 1000 mL via INTRAVENOUS

## 2017-02-15 MED ORDER — FAMOTIDINE 20 MG PO TABS
20.0000 mg | ORAL_TABLET | Freq: Two times a day (BID) | ORAL | Status: DC
  Administered 2017-02-15: 20 mg via ORAL
  Filled 2017-02-15: qty 1

## 2017-02-15 MED ORDER — GLUCERNA SHAKE PO LIQD
237.0000 mL | Freq: Three times a day (TID) | ORAL | Status: DC
  Administered 2017-02-15 – 2017-02-16 (×3): 237 mL

## 2017-02-15 MED ORDER — ASPIRIN 325 MG PO TABS
325.0000 mg | ORAL_TABLET | Freq: Every day | ORAL | Status: DC
  Administered 2017-02-16 – 2017-02-18 (×3): 325 mg
  Filled 2017-02-15 (×3): qty 1

## 2017-02-15 MED ORDER — ATORVASTATIN CALCIUM 40 MG PO TABS
80.0000 mg | ORAL_TABLET | Freq: Every day | ORAL | Status: DC
  Administered 2017-02-15 – 2017-02-17 (×3): 80 mg
  Filled 2017-02-15 (×3): qty 2

## 2017-02-15 MED ORDER — METOCLOPRAMIDE HCL 5 MG/5ML PO SOLN
5.0000 mg | Freq: Four times a day (QID) | ORAL | Status: DC
  Administered 2017-02-15 – 2017-02-18 (×10): 5 mg
  Filled 2017-02-15 (×10): qty 10

## 2017-02-15 MED ORDER — SODIUM CHLORIDE 0.45 % IV SOLN
INTRAVENOUS | Status: DC
  Administered 2017-02-15 – 2017-02-18 (×5): via INTRAVENOUS

## 2017-02-15 MED ORDER — LABETALOL HCL 200 MG PO TABS
400.0000 mg | ORAL_TABLET | Freq: Two times a day (BID) | ORAL | Status: DC
  Administered 2017-02-15 – 2017-02-18 (×6): 400 mg
  Filled 2017-02-15 (×6): qty 2

## 2017-02-15 NOTE — H&P (Signed)
History and Physical    [name]:[id number] DOB: 22-Feb-1961 DOA: 02/15/2017  PCP: Patient, No Pcp Per   Patient coming from: Home.  I have personally briefly reviewed patient's old medical records in [facility]  Chief Complaint: Weight loss and placement needs.  HPI: Ronald Ortiz is a 56 y.o. 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 Family members state that they are currently indigent and unable to even pay for their utilities. They mentioned that they call the department of social services today and were told to bring the patient to the emergency department for evaluation and placement.   ED Course: Initial vital signs show temperature 97.42F, pulse of 105 BPM, respirations 16 RPN, blood pressure 101/66 mmHg an O2 sat of 97% on room air. Urinalysis shows ketonuria, but is otherwise unremarkable. WBC 6.5 with a normal differential, hemoglobin 12.7 g/dL and platelets 811. Sodium 141, potassium 4.2, chloride 101, bicarbonate 29 mmol/L. BUN was 55, creatinine 1.35 and glucose 163 mg/dL. He received a normal saline 1000 mL bolus with resolution of tachycardia and improvement of blood pressure.   Review of Systems: As per HPI otherwise 10 point review of systems negative.   Past Medical History:  Diagnosis Date  . CHF (congestive heart failure) (HCC)   . Diabetes mellitus without complication (HCC)   .  Hypercholesteremia   . Hypertension   . Renal disorder   . Stroke [facility])    feb 14th    Past Surgical History:  Procedure Laterality Date  . PEG TUBE PLACEMENT       reports that he has never smoked. He has never used smokeless tobacco. He reports that he does not drink alcohol or use drugs.  No Known Allergies  Family History  Problem Relation Age of Onset  . Hypertension Mother     Prior to Admission medications   Medication Sig Start Date End Date Taking? Authorizing Provider  amLODipine (NORVASC) 10 MG tablet Place 10 mg into feeding tube daily.   Yes [provider]  aspirin 325 MG tablet Place 325 mg into feeding tube daily.   Yes [provider]  atorvastatin (LIPITOR) 80 MG tablet Place 80 mg into feeding tube at bedtime.   Yes [provider]  clopidogrel (PLAVIX) 75 MG tablet Place 75 mg into feeding tube daily.   Yes [provider]  docusate sodium (COLACE) 100 MG capsule 100 mg 2 (two) times daily as needed for mild constipation. Pt takes via peg tube.   Yes [provider]  labetalol (NORMODYNE) 200 MG tablet Place 400 mg into feeding tube 2 (two) times daily.   Yes [provider]  metoCLOPramide (REGLAN) 5 MG/5ML solution Place 5 mg into feeding tube 4 (four) times daily.   Yes [provider]  mirtazapine (REMERON) 15 MG tablet Take 15 mg by mouth at bedtime.   Yes [provider]  phenylephrine (NEO-SYNEPHRINE) 0.5 % nasal solution Place 1 drop into both nostrils every 8 (eight) hours as  needed (for epistaxis).   Yes [provider]  polyvinyl alcohol (LIQUIFILM TEARS) 1.4 % ophthalmic solution Place 1 drop into both eyes every 6 (six) hours.   Yes [provider]  ranitidine (ZANTAC) 150 MG tablet Take 150 mg by mouth 2 (two) times daily.   Yes [provider]  fluticasone (FLONASE) 50 MCG/ACT nasal spray Place 2 sprays into both nostrils daily. Patient not taking:  Reported on 02/15/2017 11/25/16 12/02/16  [name], MD    Physical Exam: Vitals:   02/15/17 2030 02/15/17 2100 02/15/17 2130 02/15/17 2213  BP: 109/80 113/82 106/80 117/79  Pulse: 96 92 87 83  Resp:    16  Temp:    98 F (36.7 C)  TempSrc:    Oral  SpO2: 100% 99% 100% 100%  Weight:    59.3 kg (130 lb 11.7 oz)  Height:    6' (1.829 m)    Constitutional: Cachetic, disheveled but otherwise in NAD Eyes: Right corneal opacity and blindness. ENMT: Mucous membranes are dry. Posterior pharynx clear of any exudate or lesions. Dentition is mostly absent and in poor state of repair. Neck: normal, supple, no masses, no thyromegaly Respiratory: Clear to auscultation bilaterally, no wheezing, no crackles. Normal respiratory effort. No accessory muscle use.  Cardiovascular: Regular rate and rhythm, no murmurs / rubs / gallops. No extremity edema. 2+ pedal pulses. No carotid bruits.  Abdomen: PEG in place, soft, no tenderness, no masses palpated. No hepatosplenomegaly. Bowel sounds positive.  Musculoskeletal: No clubbing / cyanosis. Good ROM, no contractures. Normal muscle tone.  Skin: Stage I sacral skin sore with minor surrounding abrasions Neurologic:  Dysarthric speech. Able to answer yes or no questions Decreased sensation on the left. Left-sided hemiparesis 3/5. Psychiatric: Alert and oriented x 2, partially oriented to time and situation.   Labs on Admission: I have personally reviewed following labs and imaging studies  CBC:  Recent Labs Lab 02/15/17 1926  WBC 6.5  NEUTROABS 5.2  HGB 12.7*  HCT 38.4*  MCV 92.3  PLT 288   Basic Metabolic Panel:  Recent Labs Lab 02/15/17 1926  NA 141  K 4.2  CL 101  CO2 29  GLUCOSE 163*  BUN 55*  CREATININE 1.35*  CALCIUM 10.3   GFR: Estimated Creatinine Clearance: 53.1 mL/min (A) (by C-G formula based on SCr of 1.35 mg/dL (H)). Liver Function Tests: No results for input(s): AST, ALT, ALKPHOS, BILITOT, PROT, ALBUMIN in the last  168 hours. No results for input(s): LIPASE, AMYLASE in the last 168 hours. No results for input(s): AMMONIA in the last 168 hours. Coagulation Profile: No results for input(s): INR, PROTIME in the last 168 hours. Cardiac Enzymes: No results for input(s): CKTOTAL, CKMB, CKMBINDEX, TROPONINI in the last 168 hours. BNP (last 3 results) No results for input(s): PROBNP in the last 8760 hours. HbA1C: No results for input(s): HGBA1C in the last 72 hours. CBG: No results for input(s): GLUCAP in the last 168 hours. Lipid Profile: No results for input(s): CHOL, HDL, LDLCALC, TRIG, CHOLHDL, LDLDIRECT in the last 72 hours. Thyroid Function Tests: No results for input(s): TSH, T4TOTAL, FREET4, T3FREE, THYROIDAB in the last 72 hours. Anemia Panel: No results for input(s): VITAMINB12, FOLATE, FERRITIN, TIBC, IRON, RETICCTPCT in the last 72 hours. Urine analysis:    Component Value Date/Time   COLORURINE YELLOW 02/15/2017 1910   APPEARANCEUR CLEAR 02/15/2017 1910   LABSPEC 1.025 02/15/2017 1910   PHURINE 5.0 02/15/2017 1910   GLUCOSEU NEGATIVE 02/15/2017 1910   HGBUR  NEGATIVE 02/15/2017 1910   BILIRUBINUR NEGATIVE 02/15/2017 1910   KETONESUR 20 (A) 02/15/2017 1910   PROTEINUR NEGATIVE 02/15/2017 1910   NITRITE NEGATIVE 02/15/2017 1910   LEUKOCYTESUR NEGATIVE 02/15/2017 1910    Radiological Exams on Admission: No results found.  EKG: Independently reviewed. [name]  Assessment/Plan Principal Problem:   Failure to thrive in adult The patient has been losing weight and deteriorating clinically at home. Family is currently unable to care for the patient. Admit for IV hydration, dietitian, PT and social services evaluation  Active Problems:   Hypertension Continue amlodipine 10 mg per tube daily. Continue labetalol 400 mg per feeding tube twice a day. Monitor blood pressure.    Diabetes mellitus without complication (HCC)  Glucerna 8 ounces per tube times a day. Consult dietitian in  a.m. CBG monitoring while in the hospital.    Hypercholesteremia Continue atorvastatin 80 mg per tube daily. Monitor LFTs and lipid panel yearly.    Stroke [facility](HCC)   Residual left-sided hemiparesis. Supportive care. Consult physical therapy in the morning.    Volume depletion Continue IV hydration with half normal saline infusion. Follow-up renal function and electrolytes in a.m.     DVT prophylaxis: Heparin SQ. Code Status: Full code. Family Communication:  Disposition Plan: Admit for IV hydration, dietitian, PT and social services evaluation. Consults called: Dietitian, physical therapy and social services. Admission status: Observation/MedSurg.   Bobette Moavid Manuel Larson Limones MD [facility] Pager [phone].  If 7PM-7AM, please contact night-coverage [email or web address] Password [email or web address]  02/15/2017, 10:33 PM

## 2017-02-15 NOTE — ED Provider Notes (Signed)
[facility]-EMERGENCY DEPT Provider Note   CSN: [id number] Arrival date & time: 02/15/17  1653  By signing my name below, I, Phillips ClimesFabiola de Louis, attest that this documentation has been prepared under the direction and in the presence of Raeford RazorKohut, Blimy Napoleon, MD . Electronically Signed: Phillips ClimesFabiola de Louis, Scribe. 02/15/2017. 8:14 PM.  History   Chief Complaint Chief Complaint  Patient presents with  . Needs Placement   HPI Comments Ronald Ortiz is a 56 y.o. non-verbal male, with a PMHx significant for CVA with residual left sided weakness, DM, CHF and HTN, who presents to the Emergency Department for assistance with obtaining long-term care placement.  Pt's family states that they can no longer take care of him.  Pt is unable to stand without significant assistance, bed bound at baseline and fed through a feeding tube.  They are not receiving any kind of assistance at this time.  Social services advised them to come to the ED for placement assistance.  They are unable to afford home health services. They cannot even afford their utilities and concerned about their electricity being cut off. They were unable to obtain Medicaid and that pt is currently on a 8 month waiting list for disability insurance.  They state that overall, his sx have worsened since picking him up [facility] [place] [facility] in [place], x4 months ago.   The history is provided by the patient and medical records. No language interpreter was used.   Past Medical History:  Diagnosis Date  . CHF (congestive heart failure) (HCC)   . Diabetes mellitus without complication (HCC)   . Hypercholesteremia   . Hypertension   . Renal disorder   . Stroke [facility](HCC)    feb 14th    There are no active problems to display for this patient.   Past Surgical History:  Procedure Laterality Date  . PEG TUBE PLACEMENT         Home Medications    Prior to Admission medications   Medication Sig Start Date End Date Taking? Authorizing Provider    amLODipine (NORVASC) 10 MG tablet Place 10 mg into feeding tube daily.   Yes [provider]  aspirin 325 MG tablet Place 325 mg into feeding tube daily.   Yes [provider]  atorvastatin (LIPITOR) 80 MG tablet Place 80 mg into feeding tube at bedtime.   Yes [provider]  clopidogrel (PLAVIX) 75 MG tablet Place 75 mg into feeding tube daily.   Yes [provider]  docusate sodium (COLACE) 100 MG capsule 100 mg 2 (two) times daily as needed for mild constipation. Pt takes via peg tube.   Yes [provider]  labetalol (NORMODYNE) 200 MG tablet Place 400 mg into feeding tube 2 (two) times daily.   Yes [provider]  metoCLOPramide (REGLAN) 5 MG/5ML solution Place 5 mg into feeding tube 4 (four) times daily.   Yes [provider]  mirtazapine (REMERON) 15 MG tablet Take 15 mg by mouth at bedtime.   Yes [provider]  phenylephrine (NEO-SYNEPHRINE) 0.5 % nasal solution Place 1 drop into both nostrils every 8 (eight) hours as needed (for epistaxis).   Yes [provider]  polyvinyl alcohol (LIQUIFILM TEARS) 1.4 % ophthalmic solution Place 1 drop into both eyes every 6 (six) hours.   Yes [provider]  ranitidine (ZANTAC) 150 MG tablet Take 150 mg by mouth 2 (two) times daily.   Yes [provider]  fluticasone (FLONASE) 50 MCG/ACT  nasal spray Place 2 sprays into both nostrils daily. Patient not taking: Reported on 02/15/2017 11/25/16 12/02/16  [name], MD    Family History No family history on file.  Social History Social History  Substance Use Topics  . Smoking status: Never Smoker  . Smokeless tobacco: Never Used  . Alcohol use No     Allergies   Patient has no known allergies.  Review of Systems Review of Systems  Unable to perform ROS: Patient nonverbal  Gastrointestinal: Positive for abdominal pain and rectal pain.   Physical Exam Updated Vital Signs BP 101/66 (BP  Location: Left Arm)   Pulse (!) 105   Temp 97.3 F (36.3 C) (Oral)   Resp 16   Ht 5\' 9"  (1.753 m)   Wt 165 lb (74.8 kg)   SpO2 97%   BMI 24.37 kg/m   Physical Exam  Constitutional:  Cachetic.  HENT:  Head: Normocephalic and atraumatic.  Corneal opacity of the right eye.  Eyes: EOM are normal.  Neck: Normal range of motion.  Cardiovascular: Normal rate, regular rhythm, normal heart sounds and intact distal pulses.   Pulmonary/Chest: Effort normal. No respiratory distress.  Coarse breath sounds bilaterally.  Abdominal: Soft.   G-tube in place.  Abdomen soft, non-tender, non-distended.  Neurological: He is alert.  Limited speech.  Dysarthric.  Seems to understand most questions.  Skin: Skin is warm and dry.  Psychiatric: He has a normal mood and affect. Judgment normal.  Nursing note and vitals reviewed.  ED Treatments / Results  DIAGNOSTIC STUDIES: Oxygen Saturation is 97% on room air, normal by my interpretation.    COORDINATION OF CARE: 6:55 PM Discussed placement options with pt's primary caregivers, who are tearful in the room.    Labs (all labs ordered are listed, but only abnormal results are displayed) Labs Reviewed  CBC WITH DIFFERENTIAL/PLATELET - Abnormal; Notable for the following:       Result Value   RBC 4.16 (*)    Hemoglobin 12.7 (*)    HCT 38.4 (*)    Lymphs Abs 0.5 (*)    All other components within normal limits  BASIC METABOLIC PANEL - Abnormal; Notable for the following:    Glucose, Bld 163 (*)    BUN 55 (*)    Creatinine, Ser 1.35 (*)    GFR calc non Af Amer 58 (*)    All other components within normal limits  URINALYSIS, ROUTINE W REFLEX MICROSCOPIC - Abnormal; Notable for the following:    Ketones, ur 20 (*)    All other components within normal limits  CBC - Abnormal; Notable for the following:    RBC 3.54 (*)    Hemoglobin 10.9 (*)    HCT 32.9 (*)    All other components within normal limits  COMPREHENSIVE METABOLIC PANEL - Abnormal;  Notable for the following:    Potassium 3.2 (*)    Glucose, Bld 108 (*)    BUN 48 (*)    Total Protein 6.3 (*)    Albumin 3.4 (*)    All other components within normal limits  BASIC METABOLIC PANEL - Abnormal; Notable for the following:    Potassium 3.4 (*)    BUN 27 (*)    All other components within normal limits  HIV ANTIBODY (ROUTINE TESTING)    EKG  EKG Interpretation None       Radiology No results found.  Procedures Procedures (including critical care time)  Medications Ordered in ED Medications - No data  to display   Initial Impression / Assessment and Plan / ED Course  I have reviewed the triage vital signs and the nursing notes.  Pertinent labs & imaging results that were available during my care of the patient were reviewed by me and considered in my medical decision making (see chart for details).     55yM with failure to thrive. Family both physically and financially take care of him. They called DSS themselves and advised to come to ED.   Final Clinical Impressions(s) / ED Diagnoses   Final diagnoses:  Dehydration  Physical deconditioning  Adult failure to thrive syndrome   I personally preformed the services scribed in my presence. The recorded information has been reviewed is accurate. Raeford Razor, MD.   New Prescriptions New Prescriptions   No medications on file     Raeford Razor, MD 02/18/17 [phone]

## 2017-02-15 NOTE — ED Triage Notes (Signed)
Family brought patient in ED for placement. States they are unable to care for patient at home and called DSS and was told to come to ED to be admitted then placed. Patient has not complaints at this time.

## 2017-02-16 ENCOUNTER — Encounter (HOSPITAL_COMMUNITY): Payer: Self-pay | Admitting: Primary Care

## 2017-02-16 DIAGNOSIS — E869 Volume depletion, unspecified: Secondary | ICD-10-CM | POA: Diagnosis present

## 2017-02-16 DIAGNOSIS — R824 Acetonuria: Secondary | ICD-10-CM | POA: Diagnosis present

## 2017-02-16 DIAGNOSIS — E119 Type 2 diabetes mellitus without complications: Secondary | ICD-10-CM | POA: Diagnosis present

## 2017-02-16 DIAGNOSIS — E860 Dehydration: Secondary | ICD-10-CM | POA: Diagnosis present

## 2017-02-16 DIAGNOSIS — R1310 Dysphagia, unspecified: Secondary | ICD-10-CM | POA: Diagnosis present

## 2017-02-16 DIAGNOSIS — Z79899 Other long term (current) drug therapy: Secondary | ICD-10-CM | POA: Diagnosis not present

## 2017-02-16 DIAGNOSIS — R54 Age-related physical debility: Secondary | ICD-10-CM | POA: Diagnosis present

## 2017-02-16 DIAGNOSIS — Z681 Body mass index (BMI) 19 or less, adult: Secondary | ICD-10-CM | POA: Diagnosis not present

## 2017-02-16 DIAGNOSIS — R64 Cachexia: Secondary | ICD-10-CM | POA: Diagnosis present

## 2017-02-16 DIAGNOSIS — Z931 Gastrostomy status: Secondary | ICD-10-CM | POA: Diagnosis not present

## 2017-02-16 DIAGNOSIS — R000 Tachycardia, unspecified: Secondary | ICD-10-CM | POA: Diagnosis present

## 2017-02-16 DIAGNOSIS — E876 Hypokalemia: Secondary | ICD-10-CM | POA: Diagnosis present

## 2017-02-16 DIAGNOSIS — Z7902 Long term (current) use of antithrombotics/antiplatelets: Secondary | ICD-10-CM | POA: Diagnosis not present

## 2017-02-16 DIAGNOSIS — Z7982 Long term (current) use of aspirin: Secondary | ICD-10-CM | POA: Diagnosis not present

## 2017-02-16 DIAGNOSIS — Z515 Encounter for palliative care: Secondary | ICD-10-CM | POA: Diagnosis present

## 2017-02-16 DIAGNOSIS — I110 Hypertensive heart disease with heart failure: Secondary | ICD-10-CM | POA: Diagnosis present

## 2017-02-16 DIAGNOSIS — I69354 Hemiplegia and hemiparesis following cerebral infarction affecting left non-dominant side: Secondary | ICD-10-CM | POA: Diagnosis not present

## 2017-02-16 DIAGNOSIS — R4702 Dysphasia: Secondary | ICD-10-CM | POA: Diagnosis present

## 2017-02-16 DIAGNOSIS — E7800 Pure hypercholesterolemia, unspecified: Secondary | ICD-10-CM | POA: Diagnosis present

## 2017-02-16 DIAGNOSIS — Z7189 Other specified counseling: Secondary | ICD-10-CM

## 2017-02-16 DIAGNOSIS — R627 Adult failure to thrive: Secondary | ICD-10-CM | POA: Diagnosis present

## 2017-02-16 DIAGNOSIS — I509 Heart failure, unspecified: Secondary | ICD-10-CM | POA: Diagnosis present

## 2017-02-16 DIAGNOSIS — E43 Unspecified severe protein-calorie malnutrition: Secondary | ICD-10-CM | POA: Diagnosis present

## 2017-02-16 DIAGNOSIS — Z7401 Bed confinement status: Secondary | ICD-10-CM | POA: Diagnosis not present

## 2017-02-16 DIAGNOSIS — H5440 Blindness, one eye, unspecified eye: Secondary | ICD-10-CM | POA: Diagnosis present

## 2017-02-16 LAB — CBC
HCT: 32.9 % — ABNORMAL LOW (ref 39.0–52.0)
HEMOGLOBIN: 10.9 g/dL — AB (ref 13.0–17.0)
MCH: 30.8 pg (ref 26.0–34.0)
MCHC: 33.1 g/dL (ref 30.0–36.0)
MCV: 92.9 fL (ref 78.0–100.0)
PLATELETS: 250 10*3/uL (ref 150–400)
RBC: 3.54 MIL/uL — ABNORMAL LOW (ref 4.22–5.81)
RDW: 13.1 % (ref 11.5–15.5)
WBC: 4.8 10*3/uL (ref 4.0–10.5)

## 2017-02-16 LAB — COMPREHENSIVE METABOLIC PANEL
ALBUMIN: 3.4 g/dL — AB (ref 3.5–5.0)
ALK PHOS: 88 U/L (ref 38–126)
ALT: 20 U/L (ref 17–63)
ANION GAP: 8 (ref 5–15)
AST: 20 U/L (ref 15–41)
BUN: 48 mg/dL — ABNORMAL HIGH (ref 6–20)
CHLORIDE: 102 mmol/L (ref 101–111)
CO2: 27 mmol/L (ref 22–32)
Calcium: 8.9 mg/dL (ref 8.9–10.3)
Creatinine, Ser: 0.97 mg/dL (ref 0.61–1.24)
GFR calc Af Amer: >60 mL/min (ref >60)
GFR calc non Af Amer: >60 mL/min (ref >60)
Glucose, Bld: 108 mg/dL — ABNORMAL HIGH (ref 65–99)
POTASSIUM: 3.2 mmol/L — AB (ref 3.5–5.1)
SODIUM: 137 mmol/L (ref 135–145)
Total Bilirubin: 0.8 mg/dL (ref 0.3–1.2)
Total Protein: 6.3 g/dL — ABNORMAL LOW (ref 6.5–8.1)

## 2017-02-16 MED ORDER — OSMOLITE 1.5 CAL PO LIQD
237.0000 mL | Freq: Four times a day (QID) | ORAL | Status: DC
  Administered 2017-02-16: 237 mL
  Filled 2017-02-16 (×4): qty 237

## 2017-02-16 MED ORDER — POTASSIUM CHLORIDE CRYS ER 20 MEQ PO TBCR
40.0000 meq | EXTENDED_RELEASE_TABLET | Freq: Once | ORAL | Status: AC
  Administered 2017-02-16: 40 meq via ORAL
  Filled 2017-02-16: qty 2

## 2017-02-16 MED ORDER — PHENYLEPHRINE HCL 0.25 % NA SOLN
1.0000 | Freq: Three times a day (TID) | NASAL | Status: DC | PRN
  Administered 2017-02-18: 1 via NASAL
  Filled 2017-02-16: qty 15

## 2017-02-16 MED ORDER — MIRTAZAPINE 15 MG PO TABS
15.0000 mg | ORAL_TABLET | Freq: Every day | ORAL | Status: DC
  Administered 2017-02-16 – 2017-02-17 (×2): 15 mg
  Filled 2017-02-16 (×2): qty 1

## 2017-02-16 MED ORDER — FAMOTIDINE 20 MG PO TABS
20.0000 mg | ORAL_TABLET | Freq: Two times a day (BID) | ORAL | Status: DC
  Administered 2017-02-16 – 2017-02-18 (×4): 20 mg
  Filled 2017-02-16 (×4): qty 1

## 2017-02-16 NOTE — Progress Notes (Addendum)
LCSW consulted for failure to thrive, possible placement.  Chart reviewed and attempted to speak with patient, however limited information is provided. No family at bedside.  Patient reports he lives with his brother Ronald Ortiz and LCSW can call him regarding more information. [facility] to [facility], phone turned off, but message left.  Awaiting call back. [facility] [place] [facility] [place] [facility]. Patient reports "I wanted to be in [place]", but that was all the information that could be provided.  Will discuss plans with family. At this time, patient lacking a payor source for placement.   Per note in march, family had applied for medicaid, but chart does not reflect this information. Per CM family reports they applied a couple weeks ago.  Call placed to financial counselor in reference of status and if this information was accurate. If [place] [facility], he should qualify for medicaid in [place], but limited history is known to confirm.  Plan: Placement will be difficult with no payor source.  No other contacts listed.  Will await call back or if family arrives to discuss plans and resources. 3:00 PM Family has yet to call back and per nursing no family has arrived. Due to patient being medically stable and inability to care for self as outlined in admission, [place] police have been called to do a well check at home address to make contact with family. This is a non-emergency check in effort to get family to participate in the care of patient. Police will call back with findings.  MD paged and updated.  3:52 PM LCSW has made contact with family after police completed well check.  Ronald Ortiz at [phone] was contacted and answered call. Reports patient was brought back to [place] after he was kicked out of a SNF due to lack of finances and unable to remain. She reports he has never qualified for medicaid and they have applied, but unclear as to why he has not been approved.   She is unable to give history of patient or who is currently the case manager for medicaid case.  Marylene Landngela reports patient's brother is unable to care for him as he is not working, suffered a back injury and she is actively pursing jobs and has job interviews.  She reports she cannot manage him either, was told by SS at DSS to bring patient to the hospital for placement.  Marylene Landngela reports she has limited fund to keep utilities on.  LCSW has called APS to report abanonment.  Unclear if case will be accepted, but report made as patient has been deemed to not be able to care for himself and his caregivers have dropped him off in the hospital with no intentions of picking him up reporting "hell no he is not coming back here, no ay in hell".    LCSW will reach out to leadership for next steps after APS report completed. Message left and will follow up.  Still unclear if patient has applied for medicaid.  Will follow up.  4:17 PM APS called back, report APS case already opened on patient with discussion from Tobi BastosAnna Museum/gallery exhibitions officer(supervisor), APS updated on current findings of abandonment and working on case.  Call to be returned and establish plan.  Aware patient not discharging today, will be in touch tonight or tomorrow. LCSW will follow up.    Ronald EmoryHannah Barlow Harrison LCSW, MSW Clinical Social Work: Optician, dispensingystem Wide [employer] Coverage for :  [phone]

## 2017-02-16 NOTE — Evaluation (Signed)
Occupational Therapy Evaluation Patient Details Name: Ronald PesaLarry K Minchey MRN: [id number] DOB: [birth year] Today's Date: 02/16/2017    History of Present Illness Ronald Ortiz is a 56 y.o. 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 Family members state that they are currently indigent and unable to even pay for their utilities. They mentioned that they call the department of social services today and were told to bring the patient to the emergency department for evaluation and placement.    Clinical Impression   Pt received semi-reclined in bed, easily awakened with calling name, agreeable to OT evaluation. Pt is able to communicate and answer questions, dysarthria present and pt is very soft-spoken; oriented x4 this morning-understands that family is unable to provide necessary level of care for him at home. Pt requires max-total assistance for ADL completion, this has been since most recent CVA in February. Pt did [facility]stroke, however continued to require high level of care and assistance throughout rehab and post-discharge. Pt demonstrates BUE weakness, L>R, decreased coordination and sensation in LUE. At this time pt is at baseline functioning with ADL completion requiring max-total assistance at bed level, no further OT services required at this time. Recommend LTC placement as pt requires high level of  assistance and family is unable to provide the necessary level of care.     Follow Up Recommendations  No OT follow up;Other (comment) (LTC placement)    Equipment Recommendations  None recommended by OT       Precautions / Restrictions Precautions Precautions: Fall Restrictions Weight Bearing Restrictions: No      Mobility Bed Mobility               General bed mobility comments: Not tested  Transfers                 General transfer comment: Not tested        ADL either performed or assessed with clinical judgement   ADL Overall ADL's : Needs assistance/impaired Eating/Feeding: NPO Eating/Feeding Details (indicate cue type and reason): Pt has PEG tube for nutrition Grooming: Wash/dry face;Set up;Bed level   Upper Body Bathing: Maximal assistance;Bed level   Lower Body Bathing: Total assistance;Bed level   Upper Body Dressing : Maximal assistance;Bed level   Lower Body Dressing: Total assistance;Bed level   Toilet Transfer: Total assistance (bed level) Toilet Transfer Details (indicate cue type and reason): Pt has been using bedpan and urinal during hospital stay Toileting- Clothing Manipulation and Hygiene: Total assistance;Bed level   Tub/ Shower Transfer: Maximal assistance (bed level) Tub/Shower Transfer Details (indicate cue type and reason): Pt sponge bathes at bed level: max-total assist Functional mobility during ADLs: Total assistance;Wheelchair General ADL Comments: Pt is essentially bed-bound at home, requiring max-total assistance for ADL completion     Vision Baseline Vision/History: Legally blind (right eye) Patient Visual Report: No change from baseline              Pertinent Vitals/Pain Pain Assessment: Faces Faces Pain  Scale: Hurts a little bit Pain Location: left shoulder with movement Pain Descriptors / Indicators: Aching Pain Intervention(s): Limited activity within patient's tolerance;Monitored during  session;Repositioned     Hand Dominance Right   Extremity/Trunk Assessment Upper Extremity Assessment Upper Extremity Assessment: RUE deficits/detail;LUE deficits/detail RUE Deficits / Details: ROM is WNL, strength is 3/5 LUE Deficits / Details: A/ROM is approximately 50%, full P/ROM. Strength is 3-/5, decreased grip strength LUE Coordination: decreased fine motor;decreased gross motor   Lower Extremity Assessment Lower Extremity Assessment: Defer to PT evaluation       Communication Communication Communication: Expressive difficulties (pt can communicate, is very soft-spoken and dysarthric)   Cognition Arousal/Alertness: Awake/alert Behavior During Therapy: WFL for tasks assessed/performed Overall Cognitive Status: Within Functional Limits for tasks assessed                                 General Comments: Pt is oriented x4   General Comments               Home Living Family/patient expects to be discharged to:: Unsure Living Arrangements: Other relatives                                      Prior Functioning/Environment Level of Independence: Needs assistance  Gait / Transfers Assistance Needed: Pt uses wheelchair for functional mobility, is unable to propel chair due to weakness. Pt reports he requires significant assistance to stand with RW ADL's / Homemaking Assistance Needed: Pt requires max-total assistance for all ADL tasks. Pt is able to assist with some grooming tasks            OT Problem List: Decreased strength;Decreased activity tolerance;Impaired balance (sitting and/or standing);Decreased coordination;Impaired UE functional use       AM-PAC PT "6 Clicks" Daily Activity     Outcome Measure Help from another person eating meals?: Total (PEG Tube) Help from another person taking care of personal grooming?: A Lot Help from another person toileting, which includes using toliet, bedpan, or urinal?: Total Help from  another person bathing (including washing, rinsing, drying)?: Total Help from another person to put on and taking off regular upper body clothing?: A Lot Help from another person to put on and taking off regular lower body clothing?: Total 6 Click Score: 8   End of Session    Activity Tolerance: Patient tolerated treatment well Patient left: in bed;with call bell/phone within reach;with bed alarm set  OT Visit Diagnosis: Muscle weakness (generalized) (M62.81);Hemiplegia and hemiparesis Hemiplegia - Right/Left: Left Hemiplegia - dominant/non-dominant: Non-Dominant Hemiplegia - caused by: Cerebral infarction                Time: 0810-0826 OT Time Calculation (min): 16 min Charges:  OT General Charges $OT Visit: 1 Procedure OT Evaluation $OT Eval Moderate Complexity: 1 Procedure G-Codes: OT G-codes **NOT FOR INPATIENT CLASS** Functional Assessment Tool Used: AM-PAC 6 Clicks Daily Activity Functional Limitation: Self care Self Care Current Status (A5409): At least 80 percent but less than 100 percent impaired, limited or restricted Self Care Goal Status (W1191): At least 80 percent but less than 100 percent impaired, limited or restricted Self Care Discharge Status [phone]): At least 80 percent but less than 100 percent impaired, limited or restricted    Ezra Sites, OTR/L  [phone] 02/16/2017, 8:42 AM

## 2017-02-16 NOTE — Evaluation (Signed)
Physical Therapy Evaluation Patient Details Name: Ronald Ortiz MRN: [id number] DOB: Sep 01, 1961 Today's Date: 02/16/2017   History of Present Illness  Ronald Ortiz is a 55yo white male who is brought to [facility] by family after progressive decline, weightloss, and weakness at home. Pt survived a CVA on 10/19/16, was placed in STR, and then DC and brought back to [place] from [facility] to his borthers house. They have been unable to care for him due to indigence. upon DC from STR, the patient was MaxA for most ADL, but modified independent with bed mobility, minA for household distance AMB with RW, and transfers to [facility] in spite of sustained Lt hemiplegia. Over the last few weeks he has been unable to AMB for safety reasons, and other functional mobility has declined, requiring more physical assistance.   Clinical Impression  Pt admitted with above diagnosis. Pt currently with functional limitations due to the deficits listed below (see "PT Problem List"). Upon entry, the patient is received semirecumbent in bed, no family/caregiver present. The pt is awake and agreeable to participate. No acute distress noted at this time.The pt is alert and oriented x4, pleasant, and following simple and multi-step commands consistently. Functional mobility assessment demonstrates Mild weakness but greater hemiparetic limitation imposed by gross motor ataxia. The patient completes bed mobility with supervision and additional time for Lt weakness, STS transfer and stand-pivot transfer with hand held assist only for trunk balance, but again strength is very good. The patient was performing AMB with rehab prior to DC from STR, but has not been able to do so since then due to environmental barriers, inadequate DME, and inadequate caregiver support. The patient is generally deconditioned since DC from STR, no long able to perform bed mobility and transfers with modified independence. The patient is at high risk for falls as evidence by multiple LOB  demonstrated throughout session. Lack of insurance will more strongly dictate the rehab services and placement this patient will receive rather than his functional needs and rehabilitative prognosis. Pt will benefit from skilled PT intervention to increase independence and safety with basic mobility in preparation for discharge to the venue listed below.       Follow Up Recommendations Supervision for mobility/OOB;Supervision - Intermittent;Home health PT (Recommending Long Term Care placement; ALF or grouphome? )    Equipment Recommendations  Rolling walker with 5" wheels    Recommendations for Other Services       Precautions / Restrictions Precautions Precautions: Fall Restrictions Weight Bearing Restrictions: No      Mobility  Bed Mobility Overal bed mobility: Needs Assistance Bed Mobility: Supine to Sit     Supine to sit: Supervision        Transfers Overall transfer level: Needs assistance Equipment used: 1 person hand held assist Transfers: Sit to/from Stand;Stand Pivot Transfers Sit to Stand: Min guard Stand pivot transfers: Min guard       General transfer comment: Excellent strength noted; some trunk instability related to chronic truncal hemiplegia, but follows verbal cues well for stepping. no impulsivity noted.   Ambulation/Gait Ambulation/Gait assistance: Min assist (bed to chair. Would likely tolerated, but otherwise deferred due to being soiled with bowel. ) Ambulation Distance (Feet):  (bed to chair only) Assistive device: 1 person hand held assist          Stairs            Wheelchair Mobility    Modified Rankin (Stroke Patients Only)       Balance Overall balance  assessment: Needs assistance Sitting-balance support: Feet supported Sitting balance-Leahy Scale: Good     Standing balance support: During functional activity;Bilateral upper extremity supported Standing balance-Leahy Scale: Poor Standing balance comment: trunk  ataxia              High level balance activites: Backward walking;Side stepping;Turns               Pertinent Vitals/Pain Pain Assessment: No/denies pain    Home Living Family/patient expects to be discharged to:: Unsure Living Arrangements: Other relatives               Additional Comments: Lives in brother's home, where utilities are not working right now for financial reasons.     Prior Function Level of Independence: Needs assistance   Gait / Transfers Assistance Needed: independent [place]oot pivot transfers bed to/from WC; some AMB at STR, but none since arrivial to [place] as he is without RW and it is unsafe in current living situation.   ADL's / Homemaking Assistance Needed: Reports he indep transfered to toilet from [facility], but did require assistance for other things.         Hand Dominance   Dominant Hand: Right    Extremity/Trunk Assessment        Lower Extremity Assessment Lower Extremity Assessment: LLE deficits/detail;Overall WFL for tasks assessed       Communication   Communication: Expressive difficulties (dysarthria, hypophonia )  Cognition Arousal/Alertness: Awake/alert Behavior During Therapy: WFL for tasks assessed/performed Overall Cognitive Status: Within Functional Limits for tasks assessed                                 General Comments: Pt is oriented x4      General Comments      Exercises     Assessment/Plan    PT Assessment Patient needs continued PT services  PT Problem List Decreased strength;Decreased activity tolerance;Decreased balance;Decreased mobility;Impaired sensation       PT Treatment Interventions DME instruction;Gait training;Functional mobility training;Therapeutic activities;Therapeutic exercise;Balance training;Patient/family education;Wheelchair mobility training    PT Goals (Current goals can be found in the Care Plan section)  Acute Rehab PT Goals Patient Stated Goal: regain  strength PT Goal Formulation: With patient Time For Goal Achievement: 03/02/17 Potential to Achieve Goals: Fair    Frequency Min 2X/week   Barriers to discharge Inaccessible home environment;Decreased caregiver support      Co-evaluation               AM-PAC PT "6 Clicks" Daily Activity  Outcome Measure Difficulty turning over in bed (including adjusting bedclothes, sheets and blankets)?: A Lot Difficulty moving from lying on back to sitting on the side of the bed? : A Lot Difficulty sitting down on and standing up from a chair with arms (e.g., wheelchair, bedside commode, etc,.)?: Total Help needed moving to and from a bed to chair (including a wheelchair)?: Total Help needed walking in hospital room?: Total Help needed climbing 3-5 steps with a railing? : Total 6 Click Score: 8    End of Session Equipment Utilized During Treatment: Gait belt Activity Tolerance: Patient tolerated treatment well;No increased pain Patient left: in chair;with call bell/phone within reach Nurse Communication: Mobility status (pt has had a BM ) PT Visit Diagnosis: Unsteadiness on feet (R26.81);Other abnormalities of gait and mobility (R26.89);Adult, failure to thrive (R62.7);Hemiplegia and hemiparesis Hemiplegia - Right/Left: Left Hemiplegia - dominant/non-dominant: Non-dominant Hemiplegia - caused by: Cerebral  infarction    Time: 1156-1218 PT Time Calculation (min) (ACUTE ONLY): 22 min   Charges:   PT Evaluation $PT Eval Low Complexity: 1 Procedure PT Treatments $Therapeutic Activity: 8-22 mins   PT G Codes:   PT G-Codes **NOT FOR INPATIENT CLASS** Functional Assessment Tool Used: AM-PAC 6 Clicks Basic Mobility Functional Limitation: Changing and maintaining body position Changing and Maintaining Body Position Current Status (Z6109(G8981): At least 40 percent but less than 60 percent impaired, limited or restricted Changing and Maintaining Body Position Goal Status (U0454(G8982): At least 40  percent but less than 60 percent impaired, limited or restricted    1:41 PM, 02/16/17 Rosamaria LintsAllan C Johnchristopher Sarvis, PT, DPT Physical Therapist - [place] [phone](ASCOM)  [phone] (Office)    Sanjuanita Condrey C 02/16/2017, 1:35 PM

## 2017-02-16 NOTE — Consult Note (Signed)
Consultation Note Date: 02/16/2017   Patient Name: Ronald Ortiz  DOB: 02/20/1961  MRN: [id number]  Age / Sex: 56 y.o., male  PCP: Patient, No Pcp Per Referring Physician: Philip Aspen, Minerva Ends*  Reason for Consultation: Establishing goals of care and Psychosocial/spiritual support  HPI/Patient Profile: 56 y.o. male  with past medical history of CHF, diabetes without complication, high blood pressure and cholesterol, renal disorder, major stroke October 19, 2016 admitted for observation on 02/15/2017 with failure to thrive in adult, seeking residential SNF placement.   Clinical Assessment and Goals of Care: Ronald Ortiz is resting quietly in bed watching TV. He makes eye contact and niods when I enter the room. He appears frail and ill. He has trouble holding his head upright. He has trouble speaking, but can make his self understood if he speaks slowly and calmly.  He shares that he had been living in [place] when he had a stroke, but the only family he had their was a former mother-in-law. He shares that he is divorced, has a daughter Ronald Ortiz, aged 19, who lives in [place], but they are not close.  I share that our goal is to help him find a skilled nursing facility where he can receive more care. He shares that he and his brother went to DSS a couple of weeks ago to apply for Medicaid. He shares that he was told that DSS would contact them.  While we're talking Larita Fife, dietitian, enters. We talk about nutrition. We talk about "pleasure feedings". [name] states he has not been able to take anything other than water by mouth since his stroke. He shares that he was told that he may choke. I share that if he understands the risks, he may take pured diet, putting, ice creams, solely for the pleasure of the taste of food in his mouth. I share that he may likely develop aspiration pneumonia. I share that I'm  also worried that aspiration pneumonia will occur just from the spit in his mouth entering his lungs. [name] states that this point, he wants to continue with tube feeding and not pleasure feedings because his goal is to get his body back to as healthy as he can. I share with him that likely, he has recovered as much function as he will 4 months after his stroke.  we discuss healthcare power of attorney, see below. We also discuss advanced directives, see below.  Healthcare power of attorney NEXT OF KIN - [name] has no legal healthcare power of attorney at this time. He shares that his mother has passed. He is divorced legally. He has a daughter Ronald Ortiz who is 64 years old living in [place], but they are not close. He states that he would like for his brother, Ronald Ortiz, to make health care decisions if he is unable.   SUMMARY OF RECOMMENDATIONS   continue to treat the treatable. [name] states that his goal is for his body to return some use. I share with him that 4 months  after stroke, this is unlikely. Healthcare power of attorney paperwork completed would be beneficial, continue discussions related to code status.  Code Status/Advance Care Planning:  Full code - we discussed the concepts of treat the treatable, but allow a natural death when Ronald Ortiz heart stops on its own. I share that we cannot change his current state of health, and I expect his quality to worsen if he were to be so sick as to need CPR or intubation. Mr. Elizebeth Brookingcotton shares that he was intubated in [place] after the stroke.   Symptom Management:   per hospitalist, no additional needs at this time.  Palliative Prophylaxis:   Aspiration, Oral Care and Turn Reposition  Additional Recommendations (Limitations, Scope, Preferences):  Full Scope Treatment  Psycho-social/Spiritual:   Desire for further Chaplaincy support:no  Additional Recommendations: Caregiving  Support/Resources and Referral to  Community Resources   Prognosis:   Unable to determine, based on outcomes. 6 months or less would not be surprising based on frailty, dysphagia, weight loss of 90 pounds in 4 months. However, if [name] is able to regain some weight, he may have several years.  Discharge Planning: Patient and family are seeking residential SNF. This is difficult without financial support from Medicare/Medicaid (pending per patient).      Primary Diagnoses: Present on Admission: . Failure to thrive in adult . Hypertension . Hypercholesteremia . Stroke (HCC) . Volume depletion   I have reviewed the medical record, interviewed the patient and family, and examined the patient. The following aspects are pertinent.  Past Medical History:  Diagnosis Date  . CHF (congestive heart failure) (HCC)   . Diabetes mellitus without complication (HCC)   . Hypercholesteremia   . Hypertension   . Renal disorder   . Stroke [facility](HCC)    feb 14th   Social History   Social History  . Marital status: Single    Spouse name: Ronald Ortiz  . Number of children: Ronald Ortiz  . Years of education: Ronald Ortiz   Social History Main Topics  . Smoking status: Never Smoker  . Smokeless tobacco: Never Used  . Alcohol use No  . Drug use: No  . Sexual activity: Not Asked   Other Topics Concern  . None   Social History Narrative  . None   Family History  Problem Relation Age of Onset  . Hypertension Mother    Scheduled Meds: . amLODipine  10 mg Per Tube Daily  . aspirin  325 mg Per Tube Daily  . atorvastatin  80 mg Per Tube QHS  . chlorhexidine  15 mL Mouth Rinse BID  . clopidogrel  75 mg Per Tube Daily  . famotidine  20 mg Per Tube BID  . feeding supplement (GLUCERNA SHAKE)  237 mL Per Tube TID BM  . heparin  5,000 Units Subcutaneous Q8H  . labetalol  400 mg Per Tube BID  . mouth rinse  15 mL Mouth Rinse q12n4p  . metoCLOPramide  5 mg Per Tube QID  . mirtazapine  15 mg Per Tube QHS  . pneumococcal 23 valent vaccine  0.5 mL  Intramuscular Tomorrow-1000  . polyvinyl alcohol  1 drop Both Eyes Q6H   Continuous Infusions: . sodium chloride 125 mL/hr at 02/15/17 2321   PRN Meds:.phenylephrine Medications Prior to Admission:  Prior to Admission medications   Medication Sig Start Date End Date Taking? Authorizing Provider  amLODipine (NORVASC) 10 MG tablet Place 10 mg into feeding tube daily.   Yes [provider]  aspirin  325 MG tablet Place 325 mg into feeding tube daily.   Yes [provider]  atorvastatin (LIPITOR) 80 MG tablet Place 80 mg into feeding tube at bedtime.   Yes [provider]  clopidogrel (PLAVIX) 75 MG tablet Place 75 mg into feeding tube daily.   Yes [provider]  docusate sodium (COLACE) 100 MG capsule 100 mg 2 (two) times daily as needed for mild constipation. Pt takes via peg tube.   Yes [provider]  labetalol (NORMODYNE) 200 MG tablet Place 400 mg into feeding tube 2 (two) times daily.   Yes [provider]  metoCLOPramide (REGLAN) 5 MG/5ML solution Place 5 mg into feeding tube 4 (four) times daily.   Yes [provider]  mirtazapine (REMERON) 15 MG tablet Take 15 mg by mouth at bedtime.   Yes [provider]  phenylephrine (NEO-SYNEPHRINE) 0.5 % nasal solution Place 1 drop into both nostrils every 8 (eight) hours as needed (for epistaxis).   Yes [provider]  polyvinyl alcohol (LIQUIFILM TEARS) 1.4 % ophthalmic solution Place 1 drop into both eyes every 6 (six) hours.   Yes [provider]  ranitidine (ZANTAC) 150 MG tablet Take 150 mg by mouth 2 (two) times daily.   Yes [provider]  fluticasone (FLONASE) 50 MCG/ACT nasal spray Place 2 sprays into both nostrils daily. Patient not taking: Reported on 02/15/2017 11/25/16 12/02/16  [name], MD   No Known Allergies Review of Systems  Unable to perform ROS: Other    Physical Exam  Constitutional: No distress.  Frail and thin,  chronically ill appearing  HENT:  Head: Atraumatic.  Some temporal wasting  Eyes:  Right eye cloudy, blind  Cardiovascular: Normal rate and regular rhythm.   Pulmonary/Chest: Effort normal. No respiratory distress.  Moderate cough response  Abdominal: Soft. He exhibits no distension.  Peg tube site  Musculoskeletal: He exhibits no edema.  Muscle wasting in legs and arms  Neurological: He is alert.  Difficult to understand due to stroke, but able to make needs known and communicate  Skin: Skin is warm and dry.  Nursing note and vitals reviewed.   Vital Signs: BP 139/81   Pulse 77   Temp 97.5 F (36.4 C) (Oral)   Resp 20   Ht 6' (1.829 m)   Wt 59.3 kg (130 lb 11.7 oz)   SpO2 100%   BMI 17.73 kg/m  Pain Assessment: No/denies pain       SpO2: SpO2: 100 % O2 Device:SpO2: 100 % O2 Flow Rate: .   IO: Intake/output summary:  Intake/Output Summary (Last 24 hours) at 02/16/17 1313 Last data filed at 02/16/17 0300  Gross per 24 hour  Intake           933.25 ml  Output                0 ml  Net           933.25 ml    LBM: Last BM Date: 02/15/17 Baseline Weight: Weight: 74.8 kg (165 lb) Most recent weight: Weight: 59.3 kg (130 lb 11.7 oz)     Palliative Assessment/Data:   Flowsheet Rows     Most Recent Value  Intake Tab  Referral Department  Hospitalist  Unit at Time of Referral  Med/Surg Unit  Palliative Care Primary Diagnosis  Other (Comment) [FTT/Placement]  Date Notified  02/16/17  Palliative Care Type  New Palliative care  Reason for referral  Clarify Goals of Care  Date of Admission  02/16/17  Date first seen by Palliative Care  02/16/17  # of days Palliative referral response time  0 Day(s)  # of days IP prior to Palliative referral  0  Clinical Assessment  Palliative Performance Scale Score  30%  Pain Max last 24 hours  Not able to report  Pain Min Last 24 hours  Not able to report  Dyspnea Max Last 24 Hours  Not able to report  Dyspnea Min Last 24  hours  Not able to report  Psychosocial & Spiritual Assessment  Palliative Care Outcomes  Patient/Family meeting held?  Yes  Who was at the meeting?  Patient only today  Palliative Care Outcomes  Provided advance care planning, Provided psychosocial or spiritual support, Clarified goals of care      Time In: 1010 Time Out: 1050 Time Total: 40 minutes Greater than 50%  of this time was spent counseling and coordinating care related to the above assessment and plan.  Signed by: Katheran Awe, NP   Please contact Palliative Medicine Team phone at [phone] for questions and concerns.  For individual provider: See Loretha Stapler

## 2017-02-16 NOTE — Progress Notes (Signed)
PROGRESS NOTE    Ronald PesaLarry K Ortiz  ZOX:[id number] DOB: 1961-06-18 DOA: 02/15/2017 PCP: Patient, No Pcp Per     Brief Narrative:  56 y/o man who is brought to the hospital by family as they are no longer able to care for him at home. 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   Assessment & Plan:   Principal Problem:   Failure to thrive in adult Active Problems:   Hypertension   Diabetes mellitus without complication (HCC)   Hypercholesteremia   Stroke (HCC)   Volume depletion   Goals of care, counseling/discussion   Palliative care encounter   Adult Failure to Thrive -This is chronic following a CVA. -Patient's family has essentially abandoned him in the hospital stating they can no longer care for him. -SW has opened and abandonment case with [employer]. -Unclear as to what the next steps will be, but he is clearly not safe to be discharged home at this point in time.  Hypokalemia -Give 40 meq KDur via PEG.   DVT prophylaxis: SQ heparin Code Status: full code Family Communication: patient only Disposition Plan: to be determined with SW assistance  Consultants:   None  Procedures:   None  Antimicrobials:  Anti-infectives    None       Subjective: Sitting in chair, nods head is response to questions. Unable to verbalize any acute issues.  Objective: Vitals:   02/16/17 0640 02/16/17 0913 02/16/17 1131 02/16/17 1500  BP: 123/82 134/76 139/81 120/65  Pulse: 69 77  78  Resp: 15 20  18   Temp: 97.5 F (36.4 C) 97.5 F (36.4 C)  97.3 F (36.3 C)  TempSrc: Oral Oral  Oral  SpO2: 100% 100%  100%  Weight:      Height:        Intake/Output Summary (Last 24 hours) at 02/16/17 1808 Last data filed at 02/16/17 1400  Gross per 24 hour  Intake           933.25 ml  Output              400 ml  Net           533.25 ml   Filed Weights   02/15/17  1657 02/15/17 2213  Weight: 74.8 kg (165 lb) 59.3 kg (130 lb 11.7 oz)    Examination:  General exam: Alert, awake, right eye cataract Respiratory system: Clear to auscultation. Respiratory effort normal. Cardiovascular system:RRR. No murmurs, rubs, gallops. Gastrointestinal system: Abdomen is nondistended, soft and nontender. No organomegaly or masses felt. Normal bowel sounds heard. Central nervous system: awake, unable to fully assess, left hemiparesis Extremities: No C/C/E, +pedal pulses Skin: No rashes, lesions or ulcers Psychiatry: Unable to assess     Data Reviewed: I have personally reviewed following labs and imaging studies  CBC:  Recent Labs Lab 02/15/17 1926 02/16/17 0402  WBC 6.5 4.8  NEUTROABS 5.2  --   HGB 12.7* 10.9*  HCT 38.4* 32.9*  MCV 92.3 92.9  PLT 288 250   Basic Metabolic Panel:  Recent Labs Lab 02/15/17 1926 02/16/17 0402  NA 141 137  K 4.2 3.2*  CL 101 102  CO2 29 27  GLUCOSE 163* 108*  BUN 55* 48*  CREATININE 1.35* 0.97  CALCIUM 10.3 8.9   GFR: Estimated Creatinine Clearance: 73.9 mL/min (by C-G formula based on SCr of 0.97 mg/dL). Liver Function Tests:  Recent Labs Lab  02/16/17 0402  AST 20  ALT 20  ALKPHOS 88  BILITOT 0.8  PROT 6.3*  ALBUMIN 3.4*   No results for input(s): LIPASE, AMYLASE in the last 168 hours. No results for input(s): AMMONIA in the last 168 hours. Coagulation Profile: No results for input(s): INR, PROTIME in the last 168 hours. Cardiac Enzymes: No results for input(s): CKTOTAL, CKMB, CKMBINDEX, TROPONINI in the last 168 hours. BNP (last 3 results) No results for input(s): PROBNP in the last 8760 hours. HbA1C: No results for input(s): HGBA1C in the last 72 hours. CBG: No results for input(s): GLUCAP in the last 168 hours. Lipid Profile: No results for input(s): CHOL, HDL, LDLCALC, TRIG, CHOLHDL, LDLDIRECT in the last 72 hours. Thyroid Function Tests: No results for input(s): TSH, T4TOTAL, FREET4,  T3FREE, THYROIDAB in the last 72 hours. Anemia Panel: No results for input(s): VITAMINB12, FOLATE, FERRITIN, TIBC, IRON, RETICCTPCT in the last 72 hours. Urine analysis:    Component Value Date/Time   COLORURINE YELLOW 02/15/2017 1910   APPEARANCEUR CLEAR 02/15/2017 1910   LABSPEC 1.025 02/15/2017 1910   PHURINE 5.0 02/15/2017 1910   GLUCOSEU NEGATIVE 02/15/2017 1910   HGBUR NEGATIVE 02/15/2017 1910   BILIRUBINUR NEGATIVE 02/15/2017 1910   KETONESUR 20 (A) 02/15/2017 1910   PROTEINUR NEGATIVE 02/15/2017 1910   NITRITE NEGATIVE 02/15/2017 1910   LEUKOCYTESUR NEGATIVE 02/15/2017 1910   Sepsis Labs: @LABRCNTIP (procalcitonin:4,lacticidven:4)  )No results found for this or any previous visit (from the past 240 hour(s)).       Radiology Studies: No results found.      Scheduled Meds: . amLODipine  10 mg Per Tube Daily  . aspirin  325 mg Per Tube Daily  . atorvastatin  80 mg Per Tube QHS  . chlorhexidine  15 mL Mouth Rinse BID  . clopidogrel  75 mg Per Tube Daily  . famotidine  20 mg Per Tube BID  . feeding supplement (OSMOLITE 1.5 CAL)  237 mL Per Tube QID  . heparin  5,000 Units Subcutaneous Q8H  . labetalol  400 mg Per Tube BID  . mouth rinse  15 mL Mouth Rinse q12n4p  . metoCLOPramide  5 mg Per Tube QID  . mirtazapine  15 mg Per Tube QHS  . pneumococcal 23 valent vaccine  0.5 mL Intramuscular Tomorrow-1000  . polyvinyl alcohol  1 drop Both Eyes Q6H   Continuous Infusions: . sodium chloride 125 mL/hr at 02/15/17 2321     LOS: 0 days    Time spent: 25 minutes. Greater than 50% of this time was spent in direct contact with the patient coordinating care.     Chaya Jan, MD [facility] Pager [phone]  If 7PM-7AM, please contact night-coverage [email or web address] Password [facility] 02/16/2017, 6:08 PM

## 2017-02-16 NOTE — Progress Notes (Addendum)
Nutrition Follow-up  DOCUMENTATION CODES:  Pt meets criteria for severe MALNUTRITION in the context of social /envioronmental neglect as evidenced by severe unplanned wt loss 19% in 4 months and </= 50% for >/= 1 month. Patient unable to afford tube feeding and uninsured.   Underweight    INTERVENTION:   ST consulted- evaluation pending to assess safety/appropriateness for intake  Increase bolus feedings of Osmolite 1.5 @ 320 ml  via PEG every 6 hours    Tube feeding regimen provides 1920 kcal (100% of needs), 80 gr grams of protein, and 975 ml of H2O.    IVF-NS @ 125 ml/hr  NUTRITION DIAGNOSIS:  -Malnutrition severe related to social / environmental- as noted above  Inadequate oral intake related to inability to eat, dysphagia as evidenced by NPO status. Patient is asking for food.    GOAL:   Patient will meet greater than or equal to 90% of their needs  MONITOR:   TF tolerance, Labs, Weight trends  ASSESSMENT:  The patient is a 56 yo male with hx of HTN, DM, CHF and had a stroke in February. He has experienced wt loss (15 kg) 21% since here in March.   Patient is uninsured and has been unable to afford formula for tube feedings. Attempted to contact [facility] where pt was being followed but unable to leave a messsage with clinicians there to return my call.  His enteral feeding is being tolerated well per his nurse. At goal rate he will meet 100% of est needs.    Limited Nutrition-Focused physical exam completed. Findings are moderate fat and muscle depletion, and no edema.     Recent Labs Lab 02/15/17 1926 02/16/17 0402  NA 141 137  K 4.2 3.2*  CL 101 102  CO2 29 27  BUN 55* 48*  CREATININE 1.35* 0.97  CALCIUM 10.3 8.9  GLUCOSE 163* 108*   Labs: potassium 3.2, Glu 108  Meds: Remeron, Pepcid   Diet Order:   NPO  Skin:   (MASD)  Last BM:  6/13 soft  Height:   Ht Readings from Last 1 Encounters:  02/15/17 6' (1.829 m)    Weight:   Wt  Readings from Last 1 Encounters:  02/15/17 130 lb 11.7 oz (59.3 kg)    Ideal Body Weight:  81 kg  BMI:  Body mass index is 17.73 kg/m.  Estimated Nutritional Needs:   Kcal:  1770-2065 (30-35 kcal/kg)  Protein:  77-88 gr (1.3-1.5 gr/kg)  Fluid:  1.5 liters daily  EDUCATION NEEDS: None at this time    Royann ShiversLynn Kees Idrovo MS,RD,CSG,LDN Office: #[phone] Pager: [phone]

## 2017-02-17 ENCOUNTER — Inpatient Hospital Stay (HOSPITAL_COMMUNITY): Payer: Medicaid Other

## 2017-02-17 LAB — BASIC METABOLIC PANEL
Anion gap: 7 (ref 5–15)
BUN: 27 mg/dL — AB (ref 6–20)
CHLORIDE: 105 mmol/L (ref 101–111)
CO2: 26 mmol/L (ref 22–32)
Calcium: 8.9 mg/dL (ref 8.9–10.3)
Creatinine, Ser: 0.83 mg/dL (ref 0.61–1.24)
GFR calc Af Amer: >60 mL/min (ref >60)
GFR calc non Af Amer: >60 mL/min (ref >60)
GLUCOSE: 84 mg/dL (ref 65–99)
POTASSIUM: 3.4 mmol/L — AB (ref 3.5–5.1)
Sodium: 138 mmol/L (ref 135–145)

## 2017-02-17 LAB — HIV ANTIBODY (ROUTINE TESTING W REFLEX): HIV Screen 4th Generation wRfx: NONREACTIVE

## 2017-02-17 MED ORDER — POTASSIUM CHLORIDE CRYS ER 20 MEQ PO TBCR
40.0000 meq | EXTENDED_RELEASE_TABLET | Freq: Once | ORAL | Status: AC
  Administered 2017-02-17: 40 meq via ORAL
  Filled 2017-02-17: qty 2

## 2017-02-17 MED ORDER — OSMOLITE 1.5 CAL PO LIQD
320.0000 mL | Freq: Four times a day (QID) | ORAL | Status: DC
  Administered 2017-02-17 – 2017-02-18 (×4): 320 mL
  Filled 2017-02-17 (×13): qty 1000

## 2017-02-17 MED ORDER — OSMOLITE 1.5 CAL PO LIQD
237.0000 mL | Freq: Four times a day (QID) | ORAL | Status: DC
  Administered 2017-02-17: 237 mL
  Filled 2017-02-17 (×14): qty 1000

## 2017-02-17 MED ORDER — SALINE SPRAY 0.65 % NA SOLN
1.0000 | NASAL | Status: DC | PRN
  Administered 2017-02-17: 1 via NASAL
  Filled 2017-02-17: qty 44

## 2017-02-17 NOTE — Progress Notes (Signed)
Patient assessed. BBS noted to be slightly diminished, non-productive weak cough noted, RA SpO2 97%, pulse 77. Patient appeared calm and not in respiratory distress at this time, he did mention having had a nose bleed(flecks of dried maroon/brown colored secretions noted  around right nare and on left index finger) and having nasal congestion.

## 2017-02-17 NOTE — NC FL2 (Signed)
[place] MEDICAID FL2 LEVEL OF CARE SCREENING TOOL     IDENTIFICATION  Patient Name: Ronald Ortiz Birthdate: [birth year] Sex: male Admission Date (Current Location): 02/15/2017  [facility] and [place] Number:  [employer] and Address:  [facility],  [address] [place] [address]      Provider Number: [id number]  Attending Physician Name and Address:  Philip Aspen, Minerva Ends*  Relative Name and Phone Number:       Current Level of Care: Hospital Recommended Level of Care: Skilled Nursing Facility Prior Approval Number:    Date Approved/Denied:   PASRR Number:   [id number]  Discharge Plan: SNF    Current Diagnoses: Patient Active Problem List   Diagnosis Date Noted  . Goals of care, counseling/discussion   . Palliative care encounter   . Failure to thrive in adult 02/15/2017  . Hypertension 02/15/2017  . Diabetes mellitus without complication (HCC) 02/15/2017  . Hypercholesteremia 02/15/2017  . Stroke (HCC) 02/15/2017  . Volume depletion 02/15/2017    Orientation RESPIRATION BLADDER Height & Weight     Self, Place  Normal Continent Weight: 130 lb 11.7 oz (59.3 kg) Height:  6' (182.9 cm)  BEHAVIORAL SYMPTOMS/MOOD NEUROLOGICAL BOWEL NUTRITION STATUS      Continent Feeding tube (PEG tube)  AMBULATORY STATUS COMMUNICATION OF NEEDS Skin   Extensive Assist Verbally Normal                       Personal Care Assistance Level of Assistance  Bathing, Feeding, Dressing Bathing Assistance: Maximum assistance Feeding assistance: Maximum assistance Dressing Assistance: Maximum assistance     Functional Limitations Info  Sight, Hearing, Speech Sight Info: Adequate Hearing Info: Adequate Speech Info: Impaired    SPECIAL CARE FACTORS FREQUENCY  PT (By licensed PT), OT (By licensed OT), Speech therapy     PT Frequency: 5x OT Frequency: 5x     Speech Therapy Frequency: 5x      Contractures Contractures Info: Not present     Additional Factors Info  Code Status, Allergies Code Status Info: Full Code Allergies Info: NKA           Current Medications (02/17/2017):  This is the current hospital active medication list Current Facility-Administered Medications  Medication Dose Route Frequency Provider Last Rate Last Dose  . 0.45 % sodium chloride infusion   Intravenous Continuous Bobette Mo, MD 125 mL/hr at 02/17/17 0636    . amLODipine (NORVASC) tablet 10 mg  10 mg Per Tube Daily Bobette Mo, MD   10 mg at 02/17/17 5621  . aspirin tablet 325 mg  325 mg Per Tube Daily Bobette Mo, MD   325 mg at 02/17/17 3086  . atorvastatin (LIPITOR) tablet 80 mg  80 mg Per Tube QHS Bobette Mo, MD   80 mg at 02/16/17 2152  . chlorhexidine (PERIDEX) 0.12 % solution 15 mL  15 mL Mouth Rinse BID Bobette Mo, MD   15 mL at 02/17/17 0902  . clopidogrel (PLAVIX) tablet 75 mg  75 mg Per Tube Daily Bobette Mo, MD   75 mg at 02/17/17 5784  . famotidine (PEPCID) tablet 20 mg  20 mg Per Tube BID Philip Aspen, Limmie Patricia, MD   20 mg at 02/17/17 6962  . feeding supplement (OSMOLITE 1.5 CAL) liquid 237 mL  237 mL Per Tube QID Philip Aspen, Limmie Patricia, MD   237 mL at 02/17/17 0903  . heparin injection 5,000 Units  5,000 Units Subcutaneous Q8H Bobette Mortiz, David Manuel, MD   5,000 Units at 02/17/17 0534  . labetalol (NORMODYNE) tablet 400 mg  400 mg Per Tube BID Bobette Mortiz, David Manuel, MD   400 mg at 02/17/17 0902  . MEDLINE mouth rinse  15 mL Mouth Rinse q12n4p Bobette Mortiz, David Manuel, MD   15 mL at 02/17/17 1200  . metoCLOPramide (REGLAN) 5 MG/5ML solution 5 mg  5 mg Per Tube QID Bobette Mortiz, David Manuel, MD   5 mg at 02/17/17 0902  . mirtazapine (REMERON) tablet 15 mg  15 mg Per Tube QHS Philip AspenHernandez Acosta, Limmie PatriciaEstela Y, MD   15 mg at 02/16/17 2152  . phenylephrine (NEO-SYNEPHRINE) 0.25 % nasal spray 1 spray  1 spray Each Nare Q8H PRN Philip AspenHernandez Acosta, Limmie PatriciaEstela Y, MD      . pneumococcal 23 valent vaccine  (PNU-IMMUNE) injection 0.5 mL  0.5 mL Intramuscular Tomorrow-1000 Bobette Mortiz, David Manuel, MD      . polyvinyl alcohol (LIQUIFILM TEARS) 1.4 % ophthalmic solution 1 drop  1 drop Both Eyes Q6H Bobette Mortiz, David Manuel, MD   1 drop at 02/17/17 1200     Discharge Medications: Please see discharge summary for a list of discharge medications.  Relevant Imaging Results:  Relevant Lab Results:   Additional Information SSn:  [id number]     Raye SorrowCoble, Vianne Grieshop N, LCSW

## 2017-02-17 NOTE — Progress Notes (Addendum)
APS called this morning and will follow up around lunch time regarding plans for patient and next steps. Call placed to APS again 2:10 PM regarding case and next steps with regards to APS involvement and updated on placement.    3:04 PM Additional clarification regarding APS involvement completed with Ronald Ortiz.  LCSW made aware that APS is not pursing guardianship at this time.  Family was working with Ronald Ortiz at [employer] regarding medicaid application with decision to be made 6/20.  APS is willing to sign patient in to facility, earliest would be Monday.  Patient able to as well as he is his own guardian at this time.  Open case in Ronald Ortiz. Ronald Ortiz McDonald/Carrie Ortiz [phone] Per APS, Ronald Ortiz has decided to accept patient which has not been communicated to this Probation officer. Call placed to Ronald Ortiz who reports this is not true as it has yet to be determined that patient has a guardian (or APS is taking guardianship) and if patient has a payer source which Ronald Ortiz could not disclose. Call placed back to [facility] who did not answer and LCSW left message.   Currently no bed offers. However Ronald Ortiz is willing to consider patient if the above has been made clear and there is a plan.   Spoke with financial counselor who reports she has met with patient and screened for medicaid.   Call placed to [facility] leadership to discuss case and disposition.  Message left and will follow up once call returned.   Discussed with Ronald Ortiz AD for CSW.  LOG approved for 30 days for placement as patient is unable to care for self and abandonment by family. SNF work up completed and awaiting review and bed offers.  Will follow up.  Lane Hacker, MSW Clinical Social Work: Printmaker Coverage for :  [phone]

## 2017-02-17 NOTE — Progress Notes (Signed)
  Speech Language Pathology Treatment:    Patient Details Name: Ronald Ortiz MRN: [id number] DOB: [birth year] Today's Date: 02/17/2017 Time: 0258-     Assessment / Plan / Recommendation Clinical Impression  SLP briefly assessed oral motor function at bedside to determine appropriateness for swallow evaluation. Baseline vocal quality with hoarseness and reduced vocal intensity. Pt s/p PEG placement following CVA in February of 2018. Unfortunately pt unable to recall swallow interventions/prior procedures/ no swallow hx found [place]. Given complex medical hx recommend proceed to MBS to determine safety for PO initiation. ST to perform MBS this afternoon.    HPI HPI: Ronald Ortiz is a 56 y.o. 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

## 2017-02-17 NOTE — Progress Notes (Signed)
PROGRESS NOTE    [name]  ZOX:[id number] DOB: Jan 31, 1961 DOA: 02/15/2017 PCP: Patient, No Pcp Per     Brief Narrative:  56 y/o man who is brought to the hospital by family as they are no longer able to care for him at home. 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 APS case has been opened due to patient abandonment.    Assessment & Plan:   Principal Problem:   Failure to thrive in adult Active Problems:   Hypertension   Diabetes mellitus without complication (HCC)   Hypercholesteremia   Stroke (HCC)   Volume depletion   Goals of care, counseling/discussion   Palliative care encounter   Adult Failure to Thrive -This is chronic following a CVA. -Patient's family has essentially abandoned him in the hospital stating they can no longer care for him. -SW has opened and abandonment case with APS. -It appears patient will be discharging to SNF over the weekend.  Dysphagia -Following CVA. -Continue tube feeds via PEG, patient would like to eat so will ask ST to evaluate to see if comfort feeds are appropriate. For MBS today.  Hypokalemia -Give 40 meq KDur via PEG. -Recheck levels in am.   DVT prophylaxis: SQ heparin Code Status: full code Family Communication: patient only Disposition Plan: SNF in am  Consultants:   None  Procedures:   None  Antimicrobials:  Anti-infectives    None       Subjective: Lying in bed, quiet, c/o nasal congestion, wants to eat.  Objective: Vitals:   02/16/17 2033 02/17/17 0511 02/17/17 1222 02/17/17 1346  BP: 111/72 113/74  99/67  Pulse: 71 71  74  Resp: 18 18  18   Temp: 97.6 F (36.4 C) 97.7 F (36.5 C)  97.9 F (36.6 C)  TempSrc: Oral Oral  Oral  SpO2: 100% 98% 97% 100%  Weight:      Height:        Intake/Output Summary (Last 24 hours) at 02/17/17 1625 Last data filed at 02/17/17 1533  Gross per 24 hour  Intake          4568.75 ml  Output             2800 ml  Net          1768.75 ml   Filed Weights   02/15/17 1657 02/15/17 2213  Weight: 74.8 kg (165 lb) 59.3 kg (130 lb 11.7 oz)    Examination:  General exam: Alert, awake, white film over right pupil Respiratory system: Clear to auscultation. Respiratory effort normal. Cardiovascular system:RRR. No murmurs, rubs, gallops. Gastrointestinal system: Abdomen is nondistended, soft and nontender. PEG in place Central nervous system: Alert  Extremities: No C/C/E, +pedal pulses Skin: No rashes, lesions or ulcers     Data Reviewed: I have personally reviewed following labs and imaging studies  CBC:  Recent Labs Lab 02/15/17 1926 02/16/17 0402  WBC 6.5 4.8  NEUTROABS 5.2  --   HGB 12.7* 10.9*  HCT 38.4* 32.9*  MCV 92.3 92.9  PLT 288 250   Basic Metabolic Panel:  Recent Labs Lab 02/15/17 1926 02/16/17 0402 02/17/17 0458  NA 141 137 138  K 4.2 3.2* 3.4*  CL 101 102 105  CO2 29 27 26   GLUCOSE 163* 108* 84  BUN 55* 48* 27*  CREATININE 1.35* 0.97 0.83  CALCIUM 10.3 8.9 8.9   GFR: Estimated Creatinine Clearance: 86.3 mL/min (  by C-G formula based on SCr of 0.83 mg/dL). Liver Function Tests:  Recent Labs Lab 02/16/17 0402  AST 20  ALT 20  ALKPHOS 88  BILITOT 0.8  PROT 6.3*  ALBUMIN 3.4*   No results for input(s): LIPASE, AMYLASE in the last 168 hours. No results for input(s): AMMONIA in the last 168 hours. Coagulation Profile: No results for input(s): INR, PROTIME in the last 168 hours. Cardiac Enzymes: No results for input(s): CKTOTAL, CKMB, CKMBINDEX, TROPONINI in the last 168 hours. BNP (last 3 results) No results for input(s): PROBNP in the last 8760 hours. HbA1C: No results for input(s): HGBA1C in the last 72 hours. CBG: No results for input(s): GLUCAP in the last 168 hours. Lipid Profile: No results for input(s): CHOL, HDL, LDLCALC, TRIG, CHOLHDL, LDLDIRECT in the last 72  hours. Thyroid Function Tests: No results for input(s): TSH, T4TOTAL, FREET4, T3FREE, THYROIDAB in the last 72 hours. Anemia Panel: No results for input(s): VITAMINB12, FOLATE, FERRITIN, TIBC, IRON, RETICCTPCT in the last 72 hours. Urine analysis:    Component Value Date/Time   COLORURINE YELLOW 02/15/2017 1910   APPEARANCEUR CLEAR 02/15/2017 1910   LABSPEC 1.025 02/15/2017 1910   PHURINE 5.0 02/15/2017 1910   GLUCOSEU NEGATIVE 02/15/2017 1910   HGBUR NEGATIVE 02/15/2017 1910   BILIRUBINUR NEGATIVE 02/15/2017 1910   KETONESUR 20 (A) 02/15/2017 1910   PROTEINUR NEGATIVE 02/15/2017 1910   NITRITE NEGATIVE 02/15/2017 1910   LEUKOCYTESUR NEGATIVE 02/15/2017 1910   Sepsis Labs: @LABRCNTIP (procalcitonin:4,lacticidven:4)  )No results found for this or any previous visit (from the past 240 hour(s)).       Radiology Studies: Dg Swallowing Func-speech Pathology  Result Date: 02/17/2017 Objective Swallowing Evaluation: Type of Study: MBS-Modified Barium Swallow Study Patient Details Name: Ronald Ortiz MRN: [id number] Date of Birth: September 03, 1961 Today's Date: 02/17/2017 Time: SLP Start Time (ACUTE ONLY): 0330-SLP Stop Time (ACUTE ONLY): 0339 SLP Time Calculation (min) (ACUTE ONLY): 9 min Past Medical History: Past Medical History: Diagnosis Date . CHF (congestive heart failure) (HCC)  . Diabetes mellitus without complication (HCC)  . Hypercholesteremia  . Hypertension  . Renal disorder  . Stroke [facility])   feb 14th Past Surgical History: Past Surgical History: Procedure Laterality Date . PEG TUBE PLACEMENT   HPI: Ronald Ortiz is a 56 y.o. 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 MBS completed with pt upright in chair. Pt presents with a severe pharyngeal dysphagia of neurogenic etiology note hx of CVA Feb 2018. Pt s/p PEG placement for means of nutrition since CVA. Puree and thin liquid trials by teaspoon observed during MBS. Oral phase of swallow with mildly delayed AP transit. Pt with effortful attempts at swallow initiation however unable to achieve appropriate laryngeal elevation, epiglotic deflection, or adequate pharyngeal constriction resulting in poor bolus transit in pharynx. Residuals remained throughout pharynx with predominant stasis. No aspiration  or penetration observed during study however significant risk remains given pt inability to complete swallow initiation and continued risk for aspiration of residuals in pharynx. Pts immobility and decreased respiratory function also increase aspiration risk. Rommend continue NPO with alternate nutrition at PEG site including medicines. No further ST needs identified.   SLP Visit Diagnosis Dysphagia, pharyngeal phase (R13.13) Attention and concentration deficit following -- Frontal lobe and executive function deficit following -- Impact on safety and function Severe aspiration risk;Risk for inadequate nutrition/hydration   CHL IP TREATMENT RECOMMENDATION 02/17/2017 Treatment Recommendations No treatment recommended at this time   Prognosis 02/17/2017 Prognosis for Safe Diet Advancement Guarded Barriers to Reach Goals Severity of deficits;Time post onset Barriers/Prognosis Comment -- CHL IP DIET  RECOMMENDATION 02/17/2017 SLP Diet Recommendations NPO Liquid Administration via -- Medication Administration Via alternative means Compensations -- Postural Changes --   CHL IP OTHER RECOMMENDATIONS 02/17/2017 Recommended Consults -- Oral Care Recommendations Oral care QID Other Recommendations --   CHL IP FOLLOW UP RECOMMENDATIONS 02/17/2017 Follow up Recommendations Skilled Nursing facility   No flowsheet data found.     No flowsheet data found. CHL IP PHARYNGEAL PHASE 02/17/2017 Pharyngeal Phase Impaired Pharyngeal- Thin Teaspoon Pharyngeal residue - valleculae;Pharyngeal residue - pyriform;Reduced tongue base retraction;Reduced laryngeal elevation;Reduced epiglottic inversion;Reduced pharyngeal peristalsis;Other (Comment) Pharyngeal- Puree Other (Comment);Lateral channel residue;Pharyngeal residue - valleculae;Pharyngeal residue - pyriform;Pharyngeal residue - posterior pharnyx;Reduced tongue base retraction;Reduced laryngeal elevation;Reduced epiglottic inversion;Reduced pharyngeal peristalsis  No flowsheet data found. No flowsheet data found. Marcene Duoshelsea Sumney MA, CCC-SLP Acute Care Speech Language Pathologist  Kennieth RadChelsea E Sumney 02/17/2017, 4:08 PM                   Scheduled Meds: . amLODipine  10 mg Per Tube Daily  . aspirin  325 mg Per Tube Daily  . atorvastatin  80 mg Per Tube QHS  . chlorhexidine  15 mL Mouth Rinse BID  . clopidogrel  75 mg Per Tube Daily  . famotidine  20 mg Per Tube BID  . feeding supplement (OSMOLITE 1.5 CAL)  320 mL Per Tube QID  . heparin  5,000 Units Subcutaneous Q8H  . labetalol  400 mg Per Tube BID  . mouth rinse  15 mL Mouth Rinse q12n4p  . metoCLOPramide  5 mg Per Tube QID  . mirtazapine  15 mg Per Tube QHS  . pneumococcal 23 valent vaccine  0.5 mL Intramuscular Tomorrow-1000  . polyvinyl alcohol  1 drop Both Eyes Q6H   Continuous Infusions: . sodium chloride 125 mL/hr at 02/17/17 1533     LOS: 1 day    Time spent: 25 minutes. Greater than 50% of this time  was spent in direct contact with the patient coordinating care.     Chaya JanHERNANDEZ ACOSTA,ESTELA, MD [facility] Pager [phone]  If 7PM-7AM, please contact night-coverage [email or web address] Password [email or web address] 02/17/2017, 4:25 PM

## 2017-02-17 NOTE — Progress Notes (Signed)
Daily Progress Note   Patient Name: Ronald Ortiz       Date: 02/17/2017 DOB: [birth year]  Age: 56 y.o. MRN#: [id number] Attending Physician: Isaac Bliss, Olam Idler* Primary Care Physician: Patient, No Pcp Per Admit Date: 02/15/2017  Reason for Consultation/Follow-up: Establishing goals of care and Psychosocial/spiritual support  Subjective: Ronald Ortiz is resting quietly in bed, watching TV. There is no family present at this time. He greets me making and keeping eye contact. I tell him that I want to sit and visit for a while, and he states, "oh no, bad news". I share with him that I do not have bad news but just want to visit with him. I share with him that he will likely stay here in the hospital until we find placement for him. He states agreement.  We talk about healthcare power of attorney. I share that his brother Audelia Acton may be unable to be his healthcare power of attorney. I share, "this may be too much for him". Ronald Ortiz does not say anything but looks away.  We again today discussed the concepts of treat the treatable, but allow a natural death when Ronald Ortiz heart stops on its own. Ronald Ortiz asks about "chances". I share statistics. I share that he does not have to make a decision at this time, but these are important matters. He agrees that this is a lot to consider.   Length of Stay: 1  Current Medications: Scheduled Meds:  . amLODipine  10 mg Per Tube Daily  . aspirin  325 mg Per Tube Daily  . atorvastatin  80 mg Per Tube QHS  . chlorhexidine  15 mL Mouth Rinse BID  . clopidogrel  75 mg Per Tube Daily  . famotidine  20 mg Per Tube BID  . feeding supplement (OSMOLITE 1.5 CAL)  320 mL Per Tube QID  . heparin  5,000 Units Subcutaneous Q8H  . labetalol  400 mg Per Tube BID  . mouth  rinse  15 mL Mouth Rinse q12n4p  . metoCLOPramide  5 mg Per Tube QID  . mirtazapine  15 mg Per Tube QHS  . pneumococcal 23 valent vaccine  0.5 mL Intramuscular Tomorrow-1000  . polyvinyl alcohol  1 drop Both Eyes Q6H    Continuous Infusions: . sodium chloride 125 mL/hr at 02/17/17 0636  PRN Meds: phenylephrine  Physical Exam  Constitutional: No distress.  Appears frail, chronically ill  HENT:  Head: Atraumatic.  Eyes:  Right eye opaque, blind  Cardiovascular: Normal rate and regular rhythm.   Pulmonary/Chest: Effort normal. No respiratory distress.  Productive cough  Abdominal: Soft. He exhibits no distension.  Peg tube site  Musculoskeletal: He exhibits no edema.  Muscle wasting throughout  Neurological: He is alert.  Difficult to understand due to stroke, but can make needs known  Skin: Skin is warm and dry.  Nursing note and vitals reviewed.           Vital Signs: BP 113/74 (BP Location: Left Arm)   Pulse 71   Temp 97.7 F (36.5 C) (Oral)   Resp 18   Ht 6' (1.829 m)   Wt 59.3 kg (130 lb 11.7 oz)   SpO2 97%   BMI 17.73 kg/m  SpO2: SpO2: 97 % O2 Device: O2 Device: Not Delivered O2 Flow Rate:    Intake/output summary:  Intake/Output Summary (Last 24 hours) at 02/17/17 1326 Last data filed at 02/17/17 0510  Gross per 24 hour  Intake             3125 ml  Output             2000 ml  Net             1125 ml   LBM: Last BM Date: 02/16/17 Baseline Weight: Weight: 74.8 kg (165 lb) Most recent weight: Weight: 59.3 kg (130 lb 11.7 oz)       Palliative Assessment/Data:    Flowsheet Rows     Most Recent Value  Intake Tab  Referral Department  Hospitalist  Unit at Time of Referral  Med/Surg Unit  Palliative Care Primary Diagnosis  Other (Comment) [FTT/Placement]  Date Notified  02/16/17  Palliative Care Type  New Palliative care  Reason for referral  Clarify Goals of Care  Date of Admission  02/16/17  Date first seen by Palliative Care  02/16/17  # of  days Palliative referral response time  0 Day(s)  # of days IP prior to Palliative referral  0  Clinical Assessment  Palliative Performance Scale Score  30%  Pain Max last 24 hours  Not able to report  Pain Min Last 24 hours  Not able to report  Dyspnea Max Last 24 Hours  Not able to report  Dyspnea Min Last 24 hours  Not able to report  Psychosocial & Spiritual Assessment  Palliative Care Outcomes  Patient/Family meeting held?  Yes  Who was at the meeting?  Patient only today  Palliative Care Outcomes  Provided advance care planning, Provided psychosocial or spiritual support, Clarified goals of care      Patient Active Problem List   Diagnosis Date Noted  . Goals of care, counseling/discussion   . Palliative care encounter   . Failure to thrive in adult 02/15/2017  . Hypertension 02/15/2017  . Diabetes mellitus without complication ([place]) 40/98/1191  . Hypercholesteremia 02/15/2017  . Stroke ([place]) 02/15/2017  . Volume depletion 02/15/2017    Palliative Care Assessment & Plan   Patient Profile: 56 y.o. male  with past medical history of CHF, diabetes without complication, high blood pressure and cholesterol, renal disorder, major stroke October 19, 2016 admitted for observation on 02/15/2017 with failure to thrive in adult, seeking residential SNF placement.   Assessment: failure to thrive in adult status post stroke; Ronald Ortiz has left side weakness, poor mobility,  dysphasia, nutritional needs are met via PEG tube. Although he has a brother locally, his family is unable to care for him and are requesting placement in residential nursing facility.  Recommendations/Plan:  placement in residential nursing facility, continue to apply for Medicare/ Medicaid. Continue with healthcare power of attorney discussions, continue with advance directive discussions.  Goals of Care and Additional Recommendations:  Limitations on Scope of Treatment: Full Scope Treatment  Code Status:      Code Status Orders        Start     Ordered   02/15/17 2240  Full code  Continuous     02/15/17 2239    Code Status History    Date Active Date Inactive Code Status Order ID Comments User Context   This patient has a current code status but no historical code status.       Prognosis:   Unable to determine based on outcomes. 6 months or less would not be surprising based on frailty, dysphagia, weight loss of 90 pounds in 4 months. However, if Mr. Figge is able to regain some weight, he may have several years  Discharge Planning:  Goal is residential SNF. Department [facility] [place]/adult protective services is involved in Ronald Ortiz case. They assisting in placement.  Care plan was discussed with nursing staff, case manager, social worker, and Dr. Jerilee Hoh.  Thank you for allowing the Palliative Medicine Team to assist in the care of this patient.   Time In: 1100  Time Out: 1130  Total Time 30 minutes  Prolonged Time Billed  no       Greater than 50%  of this time was spent counseling and coordinating care related to the above assessment and plan.  Drue Novel, NP  Please contact Palliative Medicine Team phone at [phone] for questions and concerns.

## 2017-02-17 NOTE — Progress Notes (Signed)
LCSW following for disposition.  [facility] patient with medicaid pending and 30 Day LOG. Patient can admit this weekend and MD is aware. Contact for weekend: [place] CitizenCaroline:  [phone]. LOG completed by this Clinical research associatewriter.  SPE still working with patient currently and completing MBS.  Weekend CSW can facilitate discharge. Handoff left for CSW if ready over weekend. APS will be updated on Monday. No family involved at this time.  [facility] [place] when medically stable.  Deretha EmoryHannah Zaire Levesque LCSW, MSW Clinical Social Work: Optician, dispensingystem Wide Float Coverage for :  [phone]

## 2017-02-17 NOTE — Clinical Social Work Placement (Addendum)
   CLINICAL SOCIAL WORK PLACEMENT  NOTE  Date:  02/17/2017  Patient Details  Name: Ronald PesaLarry K Pavon MRN: [id number] Date of Birth: 04/01/1961  Clinical Social Work is seeking post-discharge placement for this patient at the Skilled  Nursing Facility level of care (*CSW will initial, date and re-position this form in  chart as items are completed):  Yes   Patient/family provided with [place] Clinical Social Work Department's list of facilities offering this level of care within the geographic area requested by the patient (or if unable, by the patient's family).  Yes   Patient/family informed of their freedom to choose among providers that offer the needed level of care, that participate in Medicare, Medicaid or managed care program needed by the patient, have an available bed and are willing to accept the patient.  Yes   Patient/family informed of 's ownership interest in [facility] and [facility], as well as of the fact that they are under no obligation to receive care at these facilities.  PASRR submitted to EDS on 02/17/17     PASRR number received on 02/17/17     Existing PASRR number confirmed on       FL2 transmitted to all facilities in geographic area requested by pt/family on 02/17/17     FL2 transmitted to all facilities within larger geographic area on       Patient informed that his/her managed care company has contracts with or will negotiate with certain facilities, including the following:            Patient/family informed of bed offers received.  02/17/2017   [facility]      [facility] [place]  Physician recommends and patient chooses bed at      SNF Patient to be transferred to   on  .  02/18/17  Patient to be transferred to facility by     EMS  Patient family notified on   of transfer.  DSS called on 6/18  Name of family member notified:       None, APS  PHYSICIAN Please sign FL2     Additional Comment:     _______________________________________________ Raye Sorrowoble, Rema Lievanos N, LCSW 02/17/2017, 12:02 PM

## 2017-02-18 NOTE — Progress Notes (Signed)
[facility] [place] per Dr. Ardyth HarpsHernandez. Pt's IV site D/C'd and WDL. Pt's G tube patent and flushed. Report called to Wilkie AyeKristy, [facility]. Verbalized understanding. Pt left floor via EMS stretcher accompanied by EMT's in stable condition.

## 2017-02-18 NOTE — Discharge Summary (Addendum)
Physician Discharge Summary  Ronald PesaLarry K Ortiz ZOX:[id number] DOB: [birth year] DOA: 02/15/2017  PCP: Patient, No Pcp Per  Admit date: 02/15/2017 Discharge date: 02/18/2017  Time spent: 45 minutes  Recommendations for Outpatient Follow-up:  -Will be discharged to SNF today. -Can use afrin PRN for nose bleeds.   Discharge Diagnoses:  Principal Problem:   Failure to thrive in adult Active Problems:   Hypertension   Diabetes mellitus without complication (HCC)   Hypercholesteremia   Stroke (HCC)   Volume depletion   Goals of care, counseling/discussion   Palliative care encounter Severe Protein Caloric Malnutrition  Discharge Condition: Stable and improved  Filed Weights   02/15/17 1657 02/15/17 2213  Weight: 74.8 kg (165 lb) 59.3 kg (130 lb 11.7 oz)    History of present illness:  As per Dr. Robb Matarrtiz on 6/13:  Ronald Ortiz is a 56 y.o. 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 Family members state that they are currently indigent and unable to even pay for their utilities. They mentioned that they call the department of social services today and were told to bring the patient to the emergency department for evaluation and placement.   ED Course: Initial vital signs show temperature 97.75F, pulse of 105 BPM, respirations 16 RPN, blood pressure 101/66 mmHg an O2 sat of 97% on room air. Urinalysis shows ketonuria, but is  otherwise unremarkable. WBC 6.5 with a normal differential, hemoglobin 12.7 g/dL and platelets [id number]. Sodium 141, potassium 4.2, chloride 101, bicarbonate 29 mmol/L. BUN was 55, creatinine 1.35 and glucose 163 mg/dL. He received a normal saline 1000 mL bolus with resolution of tachycardia and improvement of blood pressure.   Hospital Course:   Adult Failure to Thrive -This is chronic following a CVA. -Patient's family has essentially abandoned him in the hospital stating they can no longer care for him. -SW has opened and abandonment case with APS. -Patient will be discharged to SNF today.  Dysphagia -Following CVA. -Continue tube feeds via PEG, failed MBS, so not allowed to have any PO feeds.  Hypokalemia -Replaced  Procedures:  None   Consultations:  None  Discharge Instructions  Discharge Instructions    Increase activity slowly    Complete by:  As directed      Allergies as of 02/18/2017   No Known Allergies     Medication List    STOP taking these medications   fluticasone 50 MCG/ACT nasal spray Commonly known as:  FLONASE   phenylephrine 0.5 % nasal solution Commonly known as:  NEO-SYNEPHRINE     TAKE these medications   amLODipine 10 MG tablet Commonly known as:  NORVASC Place 10 mg into feeding tube daily.   aspirin 325 MG tablet Place 325 mg into feeding tube daily.   atorvastatin 80 MG tablet Commonly known as:  LIPITOR Place 80 mg into feeding tube at bedtime.   clopidogrel 75 MG tablet Commonly known as:  PLAVIX Place 75 mg into feeding tube daily.   docusate sodium 100 MG capsule  Commonly known as:  COLACE 100 mg 2 (two) times daily as needed for mild constipation. Pt takes via peg tube.   labetalol 200 MG tablet Commonly known as:  NORMODYNE Place 400 mg into feeding tube 2 (two) times daily.   metoCLOPramide 5 MG/5ML solution Commonly known as:  REGLAN Place 5 mg into feeding tube 4 (four) times daily.   mirtazapine 15 MG  tablet Commonly known as:  REMERON Take 15 mg by mouth at bedtime.   polyvinyl alcohol 1.4 % ophthalmic solution Commonly known as:  LIQUIFILM TEARS Place 1 drop into both eyes every 6 (six) hours.   ranitidine 150 MG tablet Commonly known as:  ZANTAC Take 150 mg by mouth 2 (two) times daily.      No Known Allergies Contact information for after-discharge care    Destination    [facility] Follow up.   Specialty:  Skilled Nursing Facility Contact information: [address] [place] [address] [phone]               The results of significant diagnostics from this hospitalization (including imaging, microbiology, ancillary and laboratory) are listed below for reference.    Significant Diagnostic Studies: Dg Swallowing Func-speech Pathology  Result Date: 02/17/2017 Objective Swallowing Evaluation: Type of Study: MBS-Modified Barium Swallow Study Patient Details Name: Ronald Ortiz MRN: [id number] Date of Birth: 02-10-1961 Today's Date: 02/17/2017 Time: SLP Start Time (ACUTE ONLY): 0330-SLP Stop Time (ACUTE ONLY): 0339 SLP Time Calculation (min) (ACUTE ONLY): 9 min Past Medical History: Past Medical History: Diagnosis Date . CHF (congestive heart failure) (HCC)  . Diabetes mellitus without complication (HCC)  . Hypercholesteremia  . Hypertension  . Renal disorder  . Stroke [facility])   feb 14th Past Surgical History: Past Surgical History: Procedure Laterality Date . PEG TUBE PLACEMENT   HPI: Ronald Ortiz is a 56 y.o. 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 MBS completed with pt upright in chair. Pt presents with a severe pharyngeal dysphagia of neurogenic etiology note hx of CVA Feb 2018. Pt s/p PEG placement for means of nutrition since CVA. Puree and thin liquid trials by teaspoon observed during MBS. Oral phase of swallow with mildly delayed AP transit. Pt with effortful attempts at swallow initiation however unable to achieve appropriate laryngeal elevation, epiglotic deflection, or adequate pharyngeal constriction resulting in poor bolus transit in pharynx. Residuals remained throughout pharynx with predominant stasis. No aspiration or penetration observed during study however significant risk remains given pt inability to complete swallow initiation and continued risk for aspiration of residuals in pharynx. Pts immobility and decreased respiratory function also increase aspiration risk. Rommend continue NPO with alternate nutrition at PEG site including medicines. No further ST needs identified.   SLP Visit Diagnosis Dysphagia, pharyngeal phase (R13.13) Attention and concentration deficit following -- Frontal lobe and executive  function deficit following -- Impact on safety and function Severe aspiration risk;Risk for inadequate nutrition/hydration   CHL IP TREATMENT RECOMMENDATION 02/17/2017 Treatment Recommendations No treatment recommended at this time   Prognosis 02/17/2017 Prognosis for Safe Diet Advancement Guarded Barriers to Reach Goals Severity of deficits;Time post onset Barriers/Prognosis Comment -- CHL IP DIET RECOMMENDATION 02/17/2017 SLP Diet Recommendations NPO Liquid Administration via --  Medication Administration Via alternative means Compensations -- Postural Changes --   CHL IP OTHER RECOMMENDATIONS 02/17/2017 Recommended Consults -- Oral Care Recommendations Oral care QID Other Recommendations --   CHL IP FOLLOW UP RECOMMENDATIONS 02/17/2017 Follow up Recommendations Skilled Nursing facility   No flowsheet data found.     No flowsheet data found. CHL IP PHARYNGEAL PHASE 02/17/2017 Pharyngeal Phase Impaired Pharyngeal- Thin Teaspoon Pharyngeal residue - valleculae;Pharyngeal residue - pyriform;Reduced tongue base retraction;Reduced laryngeal elevation;Reduced epiglottic inversion;Reduced pharyngeal peristalsis;Other (Comment) Pharyngeal- Puree Other (Comment);Lateral channel residue;Pharyngeal residue - valleculae;Pharyngeal residue - pyriform;Pharyngeal residue - posterior pharnyx;Reduced tongue base retraction;Reduced laryngeal elevation;Reduced epiglottic inversion;Reduced pharyngeal peristalsis  No flowsheet data found. No flowsheet data found. Marcene Duos MA, CCC-SLP Acute Care Speech Language Pathologist  Kennieth Rad 02/17/2017, 4:08 PM               Microbiology: No results found for this or any previous visit (from the past 240 hour(s)).   Labs: Basic Metabolic Panel:  Recent Labs Lab 02/15/17 1926 02/16/17 0402 02/17/17 0458  NA 141 137 138  K 4.2 3.2* 3.4*  CL 101 102 105  CO2 29 27 26   GLUCOSE 163* 108* 84  BUN 55* 48* 27*  CREATININE 1.35* 0.97 0.83  CALCIUM 10.3 8.9 8.9   Liver Function Tests:  Recent Labs Lab 02/16/17 0402  AST 20  ALT 20  ALKPHOS 88  BILITOT 0.8  PROT 6.3*  ALBUMIN 3.4*   No results for input(s): LIPASE, AMYLASE in the last 168 hours. No results for input(s): AMMONIA in the last 168 hours. CBC:  Recent Labs Lab 02/15/17 1926 02/16/17 0402  WBC 6.5 4.8  NEUTROABS 5.2  --   HGB 12.7* 10.9*  HCT 38.4* 32.9*  MCV 92.3 92.9  PLT 288 250   Cardiac Enzymes: No results for input(s): CKTOTAL, CKMB, CKMBINDEX, TROPONINI  in the last 168 hours. BNP: BNP (last 3 results)  Recent Labs  11/29/16 1313  BNP 12.0    ProBNP (last 3 results) No results for input(s): PROBNP in the last 8760 hours.  CBG: No results for input(s): GLUCAP in the last 168 hours.     SignedChaya Jan  [facility] Pager: [phone] 02/18/2017, 11:08 AM

## 2017-02-18 NOTE — Progress Notes (Signed)
Spoke with Dr Onalee Huaavid about patient's uncontrolled nose bleed. Informed her that he continues to pick his nose even after compression on multiple occasions. Order given to administer Neo-synephrine every 8hrs

## 2017-02-18 NOTE — Clinical Social Work Note (Signed)
Pt is ready for discharge today and will go to [facility]. CSW sent discharge summary to SNF through [facility] and confirmed bed with [facility] [phone]). Room and report provided to RN Shanda BumpsJessica and put in treatment team sticky note. RN Shanda BumpsJessica to arrange ambulance for transport. CSW is signing off as no further Social Work needs identified.  Corlis HoveJeneya Keyonta Barradas, LCSWA, LCASA Clinical Social Worker [phone](Weekend) [phone]

## 2017-02-20 NOTE — Progress Notes (Signed)
LCSW completed LOG for placement purposes and faxed to [facility]. Call placed to [facility] in effort to alert of weekend discharge and will follow up this week. Call placed to facility to ensure all needs met for patient for weekend discharge.  No barriers and no issues regarding transfer to SNF.  LCSW will sign off.  Lane Hacker, MSW Clinical Social Work: [employer] Coverage for :  [phone]

## 2018-02-14 ENCOUNTER — Other Ambulatory Visit: Payer: Self-pay

## 2018-02-14 ENCOUNTER — Emergency Department (HOSPITAL_COMMUNITY): Payer: Medicaid Other

## 2018-02-14 ENCOUNTER — Encounter (HOSPITAL_COMMUNITY): Payer: Self-pay | Admitting: *Deleted

## 2018-02-14 ENCOUNTER — Emergency Department (HOSPITAL_COMMUNITY)
Admission: EM | Admit: 2018-02-14 | Discharge: 2018-02-14 | Disposition: A | Discharge status: Home / Self Care | Payer: Medicaid Other | Attending: Emergency Medicine | Admitting: Emergency Medicine

## 2018-02-14 DIAGNOSIS — Z8673 Personal history of transient ischemic attack (TIA), and cerebral infarction without residual deficits: Secondary | ICD-10-CM | POA: Diagnosis not present

## 2018-02-14 DIAGNOSIS — Z7982 Long term (current) use of aspirin: Secondary | ICD-10-CM | POA: Diagnosis not present

## 2018-02-14 DIAGNOSIS — E1122 Type 2 diabetes mellitus with diabetic chronic kidney disease: Secondary | ICD-10-CM | POA: Insufficient documentation

## 2018-02-14 DIAGNOSIS — Z7902 Long term (current) use of antithrombotics/antiplatelets: Secondary | ICD-10-CM | POA: Diagnosis not present

## 2018-02-14 DIAGNOSIS — I130 Hypertensive heart and chronic kidney disease with heart failure and stage 1 through stage 4 chronic kidney disease, or unspecified chronic kidney disease: Secondary | ICD-10-CM | POA: Diagnosis not present

## 2018-02-14 DIAGNOSIS — I509 Heart failure, unspecified: Secondary | ICD-10-CM | POA: Diagnosis not present

## 2018-02-14 DIAGNOSIS — Z79899 Other long term (current) drug therapy: Secondary | ICD-10-CM | POA: Diagnosis not present

## 2018-02-14 DIAGNOSIS — R040 Epistaxis: Secondary | ICD-10-CM | POA: Diagnosis not present

## 2018-02-14 DIAGNOSIS — N189 Chronic kidney disease, unspecified: Secondary | ICD-10-CM | POA: Insufficient documentation

## 2018-02-14 DIAGNOSIS — R042 Hemoptysis: Secondary | ICD-10-CM | POA: Diagnosis present

## 2018-02-14 LAB — CBC WITH DIFFERENTIAL/PLATELET
BASOS ABS: 0 10*3/uL (ref 0.0–0.1)
BASOS PCT: 0 %
EOS ABS: 0.1 10*3/uL (ref 0.0–0.7)
EOS PCT: 2 %
HCT: 28.9 % — ABNORMAL LOW (ref 39.0–52.0)
Hemoglobin: 8.9 g/dL — ABNORMAL LOW (ref 13.0–17.0)
LYMPHS PCT: 13 %
Lymphs Abs: 0.7 10*3/uL (ref 0.7–4.0)
MCH: 27.9 pg (ref 26.0–34.0)
MCHC: 30.8 g/dL (ref 30.0–36.0)
MCV: 90.6 fL (ref 78.0–100.0)
Monocytes Absolute: 0.4 10*3/uL (ref 0.1–1.0)
Monocytes Relative: 8 %
Neutro Abs: 3.8 10*3/uL (ref 1.7–7.7)
Neutrophils Relative %: 77 %
PLATELETS: 369 10*3/uL (ref 150–400)
RBC: 3.19 MIL/uL — AB (ref 4.22–5.81)
RDW: 15.9 % — ABNORMAL HIGH (ref 11.5–15.5)
WBC: 5.1 10*3/uL (ref 4.0–10.5)

## 2018-02-14 LAB — BASIC METABOLIC PANEL
Anion gap: 10 (ref 5–15)
BUN: 28 mg/dL — AB (ref 6–20)
CO2: 32 mmol/L (ref 22–32)
CREATININE: 0.94 mg/dL (ref 0.61–1.24)
Calcium: 9.6 mg/dL (ref 8.9–10.3)
Chloride: 98 mmol/L — ABNORMAL LOW (ref 101–111)
GFR calc Af Amer: >60 mL/min (ref >60)
Glucose, Bld: 109 mg/dL — ABNORMAL HIGH (ref 65–99)
POTASSIUM: 4 mmol/L (ref 3.5–5.1)
SODIUM: 140 mmol/L (ref 135–145)

## 2018-02-14 LAB — PROTIME-INR
INR: 1.09
PROTHROMBIN TIME: 14 s (ref 11.4–15.2)

## 2018-02-14 MED ORDER — TRANEXAMIC ACID 1000 MG/10ML IV SOLN
500.0000 mg | Freq: Once | INTRAVENOUS | Status: AC
Start: 2018-02-14 — End: 2018-02-14
  Administered 2018-02-14: 500 mg via TOPICAL
  Filled 2018-02-14 (×2): qty 10

## 2018-02-14 NOTE — ED Provider Notes (Signed)
[facility] EMERGENCY DEPARTMENT Provider Note   CSN: [id number] Arrival date & time: 02/14/18  8295   History   Chief Complaint Chief Complaint  Patient presents with  . Hemoptysis     Pt seen with NP student, I performed history/physical/documentation  HPI Ronald Ortiz is a 57 y.o. male.  The history is provided by the patient and the nursing home.  Cough  This is a new problem. The problem has been gradually worsening. The cough is productive of blood-tinged sputum. There has been no fever.  With history of multiple medical problems including CHF, diabetes, stroke, PEG tube placement. Presents from [facility] facility Patient reports starting yesterday he started coughing up blood.  He also reports blood from his nose as well He has no other complaints, denies any fevers or vomiting.  No chest pain or shortness of breath. He has had this previously.  I called the nursing facility get further information.  They report he has been coughing up blood over the past day and the physician requested evaluation.  They do not feel he has any nosebleeds Past Medical History:  Diagnosis Date  . CHF (congestive heart failure) (HCC)   . Diabetes mellitus without complication (HCC)   . Hypercholesteremia   . Hypertension   . Renal disorder   . Stroke [facility])    feb 14th    Patient Active Problem List   Diagnosis Date Noted  . Goals of care, counseling/discussion   . Palliative care encounter   . Failure to thrive in adult 02/15/2017  . Hypertension 02/15/2017  . Diabetes mellitus without complication (HCC) 02/15/2017  . Hypercholesteremia 02/15/2017  . Stroke (HCC) 02/15/2017  . Volume depletion 02/15/2017    Past Surgical History:  Procedure Laterality Date  . PEG TUBE PLACEMENT          Home Medications    Prior to Admission medications   Medication Sig Start Date End Date Taking? Authorizing Provider  amLODipine (NORVASC) 10 MG tablet Place 10 mg into feeding  tube daily.    [provider]  aspirin 325 MG tablet Place 325 mg into feeding tube daily.    [provider]  atorvastatin (LIPITOR) 80 MG tablet Place 80 mg into feeding tube at bedtime.    [provider]  clopidogrel (PLAVIX) 75 MG tablet Place 75 mg into feeding tube daily.    [provider]  docusate sodium (COLACE) 100 MG capsule 100 mg 2 (two) times daily as needed for mild constipation. Pt takes via peg tube.    [provider]  labetalol (NORMODYNE) 200 MG tablet Place 400 mg into feeding tube 2 (two) times daily.    [provider]  metoCLOPramide (REGLAN) 5 MG/5ML solution Place 5 mg into feeding tube 4 (four) times daily.    [provider]  mirtazapine (REMERON) 15 MG tablet Take 15 mg by mouth at bedtime.    [provider]  polyvinyl alcohol (LIQUIFILM TEARS) 1.4 % ophthalmic solution Place 1 drop into both eyes every 6 (six) hours.    [provider]  ranitidine (ZANTAC) 150 MG tablet Take 150 mg by mouth 2 (two) times daily.    [provider]    Family History Family History  Problem Relation Age of Onset  . Hypertension Mother     Social History Social History   Tobacco Use  . Smoking status: Never Smoker  . Smokeless tobacco: Never Used  Substance Use Topics  . Alcohol  use: No  . Drug use: No     Allergies   Patient has no known allergies.   Review of Systems Review of Systems  Constitutional: Negative for fever.  HENT: Positive for nosebleeds.   Respiratory: Positive for cough.   Gastrointestinal: Negative for vomiting.  All other systems reviewed and are negative.    Physical Exam Updated Vital Signs BP 115/80 (BP Location: Left Arm)   Pulse 78   Temp (!) 97 F (36.1 C) (Axillary)   Resp 20   Ht 1.829 m (6')   Wt 57.2 kg (126 lb)   SpO2 100%   BMI 17.09 kg/m   Physical Exam CONSTITUTIONAL: Chronically ill-appearing, appears older than stated age,  disheveled HEAD: Normocephalic/atraumatic, chronic wound noted to right forehead EYES: Mild erythema and soft tissue swelling around the right eye ENMT: Saddlenose deformity, postoperative changes to nose.  Patient has blood clot in right nare.  Left nares clear.  Small amount of blood in posterior oropharynx but no active bleed. Poor dentition NECK: supple no meningeal signs SPINE/BACK:entire spine nontender CV: S1/S2 noted, no murmurs/rubs/gallops noted LUNGS: Lungs are clear to auscultation bilaterally, no apparent distress ABDOMEN: soft, nontender, PEG tube in place NEURO: Pt is awake/alert/appropriate, answers questions appropriately EXTREMITIES: pulses normal/equal, full ROM SKIN: warm, color normal PSYCH: no abnormalities of mood noted, alert and oriented to situation   ED Treatments / Results  Labs (all labs ordered are listed, but only abnormal results are displayed) Labs Reviewed  BASIC METABOLIC PANEL - Abnormal; Notable for the following components:      Result Value   Chloride 98 (*)    Glucose, Bld 109 (*)    BUN 28 (*)    All other components within normal limits  CBC WITH DIFFERENTIAL/PLATELET - Abnormal; Notable for the following components:   RBC 3.19 (*)    Hemoglobin 8.9 (*)    HCT 28.9 (*)    RDW 15.9 (*)    All other components within normal limits  PROTIME-INR    EKG None  Radiology Dg Chest Port 1 View  Result Date: 02/14/2018 CLINICAL DATA:  Hemoptysis. EXAM: PORTABLE CHEST 1 VIEW COMPARISON:  11/29/2016 FINDINGS: Mild rotation of the left. Heart size normal for technique. Normal mediastinal contours. No evidence of pneumomediastinum or pneumothorax. No focal airspace disease or large pleural effusion. Osseous structures appear stable. IMPRESSION: No acute findings. Electronically Signed   By: Rubye Oaks M.D.   On: 02/14/2018 04:47    Procedures .Epistaxis Management Date/Time: 02/14/2018 6:47 AM Performed by: Zadie Rhine,  MD Authorized by: Zadie Rhine, MD   Consent:    Consent obtained:  Verbal Procedure details:    Treatment site:  R anterior   Treatment complexity:  Limited   Treatment episode: initial   Post-procedure details:    Assessment:  Bleeding decreased   Patient tolerance of procedure:  Tolerated well, no immediate complications Comments:     Cotton swab soaked with TXA was placed on right nare for several minutes.  Bleeding has resolved, patient tolerated well   (including critical care time)  Medications Ordered in ED Medications  tranexamic acid (CYKLOKAPRON) injection 500 mg (500 mg Topical Given 02/14/18 0620)     Initial Impression / Assessment and Plan / ED Course  I have reviewed the triage vital signs and the nursing notes.  Pertinent labs & imaging results that were available during my care of the patient were reviewed by me and considered in my medical decision making (see  chart for details).     5:02 AM Patient presents for reported hemoptysis.  However on my examination it appears he has right-sided epistaxis.  He reports a previous history [place] facial[place], but is unable to tell me exactly what this is.  Previous visits in the system reveal he has had epistaxis before, but there is no mention of any surgeries.  I was able to do significant suctioning of blood appears to be improved at this time.  We will obtain laboratories as well as chest x-ray to ensure no signs of acute anemia or any signs of acute infectious etiology or aspiration in his chest. Patient does have a wound to his forehead as well as some swelling around the right eye which he reports is chronic and is being treated for infection 6:48 AM Patient improved.  He responded TXA in right nare.  He is awake alert, no distress. I suspect some of these issues are due to the fact that he continually picks his nose.  He reports the wounds on his face around his eye have been getting treated  recently and are not acute.  Advise close follow-up with ear nose and throat.  He will also need to have his hemoglobin rechecked, but I do not suspect this is a worsening or acute anemia Final Clinical Impressions(s) / ED Diagnoses   Final diagnoses:  Right-sided epistaxis    ED Discharge Orders    None       Zadie RhineWickline, Hilery Wintle, MD 02/14/18 [phone]

## 2018-02-14 NOTE — ED Notes (Signed)
EMS here to [facility].

## 2018-02-14 NOTE — ED Triage Notes (Signed)
Pt brought in by [facility] for coughing up blood that started earlier this evening; pt denies any pain

## 2018-02-14 NOTE — ED Notes (Signed)
Pt's nose bleeding has stopped. Pt currently laying comfortably in bed at time with healthcare provider at bedside.

## 2018-02-14 NOTE — ED Notes (Signed)
Pt sleeping, no distress.

## 2018-02-14 NOTE — ED Notes (Signed)
Pt unable to sign for discharge.  [facility] and EMS notified of need for transportation.

## 2018-02-14 NOTE — Discharge Instructions (Addendum)
Be Sure to follow-up with the ear nose and throat specialist in the next week.  You also need to have a recheck of your hemoglobin, CBC to make sure you are not for more anemic

## 2018-02-26 ENCOUNTER — Other Ambulatory Visit (HOSPITAL_COMMUNITY): Payer: Self-pay | Admitting: Specialist

## 2018-02-26 DIAGNOSIS — R1319 Other dysphagia: Secondary | ICD-10-CM

## 2018-02-27 ENCOUNTER — Encounter (HOSPITAL_COMMUNITY): Payer: Self-pay | Admitting: Speech Pathology

## 2018-02-27 ENCOUNTER — Other Ambulatory Visit (HOSPITAL_COMMUNITY): Payer: Self-pay | Admitting: Internal Medicine

## 2018-02-27 ENCOUNTER — Ambulatory Visit (HOSPITAL_COMMUNITY)
Admission: RE | Admit: 2018-02-27 | Discharge: 2018-02-27 | Disposition: A | Discharge status: Home / Self Care | Payer: Medicaid Other | Source: Ambulatory Visit | Attending: Internal Medicine | Admitting: Internal Medicine

## 2018-02-27 ENCOUNTER — Ambulatory Visit (HOSPITAL_COMMUNITY): Payer: Medicaid Other | Attending: Internal Medicine | Admitting: Speech Pathology

## 2018-02-27 DIAGNOSIS — R1312 Dysphagia, oropharyngeal phase: Secondary | ICD-10-CM | POA: Insufficient documentation

## 2018-02-27 DIAGNOSIS — R1310 Dysphagia, unspecified: Secondary | ICD-10-CM | POA: Diagnosis present

## 2018-02-27 DIAGNOSIS — R1319 Other dysphagia: Secondary | ICD-10-CM

## 2018-02-27 NOTE — Therapy (Signed)
[facility] [address] [place], [place], [address] Phone: [phone]   Fax:  [phone]  Modified Barium Swallow  Patient Details  Name: Ronald PesaLarry K Ortiz MRN: [id number] Date of Birth: 1961/02/21 No data recorded  Encounter Date: 02/27/2018    Past Medical History:  Diagnosis Date  . CHF (congestive heart failure) (HCC)   . Diabetes mellitus without complication (HCC)   . Hypercholesteremia   . Hypertension   . Renal disorder   . Stroke [facility](HCC)    feb 14th    Past Surgical History:  Procedure Laterality Date  . PEG TUBE PLACEMENT      There were no vitals filed for this visit.  Subjective Assessment - 02/27/18 1813    Subjective  "I have to spit all the time."    Special Tests  MBSS    Currently in Pain?  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 [facility]. He uses a PEG for all nutrition since his stroke 10/1016. He had MBSS last June 2019 during acute hospitalization with recommendation for continued NPO due to severity of dysphagia. Pt tells SLP that he occasionally taks ice chips, small sips of water, and Mt. Dew at SNF.     Type of Study  MBS-Modified Barium Swallow Study    Previous Swallow Assessment  June 2018 with recommendation for NPO    Temperature Spikes Noted  No    Respiratory Status  Room air    History of Recent Intubation  No    Behavior/Cognition  Cooperative;Pleasant mood;Alert    Oral Cavity Assessment  Excessive secretions    Oral Care Completed  by SLP  No    Self-Feeding Abilities  Able to feed self    Patient Positioning  Upright in chair    Baseline Vocal Quality  Other (comment) hypernasal    Volitional Cough  Weak    Volitional Swallow  Unable to elicit    Anatomy  Within functional limits    Pharyngeal Secretions  Not observed secondary MBS         Oral Preparation/Oral Phase - 02/27/18 1836      Oral Preparation/Oral Phase   Oral Phase  Impaired      Oral - Thin   Oral - Thin Cup  Lingual pumping;Other (Comment) premature spillage      Oral - Solids   Oral - Puree  Weak ligual manipulation;Lingual pumping      Electrical stimulation - Oral Phase   Was Electrical Stimulation Used  No       Pharyngeal Phase - 02/27/18 1837      Pharyngeal Phase   Pharyngeal Phase  Impaired      Pharyngeal - Thin   Pharyngeal- Thin Teaspoon  Delayed swallow initiation;Reduced pharyngeal peristalsis;Swallow initiation at pyriform sinus;Reduced epiglottic inversion;Reduced anterior laryngeal mobility;Reduced laryngeal elevation;Reduced tongue base retraction;Pharyngeal residue - valleculae;Pharyngeal residue - pyriform;Pharyngeal residue - cp segment;Lateral channel residue    Pharyngeal- Thin Cup  Delayed swallow initiation;Swallow initiation  at pyriform sinus;Reduced pharyngeal peristalsis;Reduced epiglottic inversion;Reduced anterior laryngeal mobility;Reduced laryngeal elevation;Reduced tongue base retraction;Pharyngeal residue - pyriform;Penetration/Apiration after swallow;Pharyngeal residue - valleculae;Lateral channel residue;Reduced airway/laryngeal closure    Pharyngeal  Material enters airway, remains ABOVE vocal cords then ejected out;Material does not enter airway    Pharyngeal- Thin Straw  Swallow initiation at pyriform sinus;Delayed swallow initiation;Reduced pharyngeal peristalsis;Reduced epiglottic inversion;Reduced anterior laryngeal mobility;Reduced laryngeal elevation;Reduced tongue base  retraction;Penetration/Apiration after swallow;Pharyngeal residue - valleculae;Lateral channel residue;Reduced airway/laryngeal closure    Pharyngeal  Material does not enter airway;Material enters airway, remains ABOVE vocal cords then ejected out      Pharyngeal - Solids   Pharyngeal- Puree  Swallow initiation at pyriform sinus;Reduced pharyngeal peristalsis;Reduced epiglottic inversion;Reduced anterior laryngeal mobility;Reduced laryngeal elevation;Reduced tongue base retraction;Pharyngeal residue - valleculae;Pharyngeal residue - pyriform;Pharyngeal residue - posterior pharnyx;Pharyngeal residue - cp segment;Lateral channel residue      Electrical Stimulation - Pharyngeal Phase   Was Electrical Stimulation Used  No       Cricopharyngeal Phase - 02/27/18 1840      Cervical Esophageal Phase   Cervical Esophageal Phase  Impaired      Cervical Esophageal Phase - Solids   Puree  Reduced cricopharyngeal relaxation;Prominent cricopharyngeal segment      Cervical Esophageal Phase - Comment   Cervical Esophageal Comment  UES opens only occasionally, prominent CP, significant pyriform and UES residuals        Plan - 02/27/18 1844    Clinical Impression Statement  Pt continues to present with severe oropharyngeal and pharyngoesophageal phase dysphagia consistent with performance on MBSS completed about one year ago. Pt has little to no observable hyolaryngeal excursion, epiglottic deflection, posterior pharyngeal wall constriction, and inconsistent UES relaxation. Pt assessed with thins via tsp/cup/straw and one bite of puree. No additional trials were administered due to severity of of pharyngoesophageal phase dysphagia. Although no gross aspiration was observed, Pt with penetration of cup/straw thin and moderate/severe pharyngeal residue with purees and less so with thins. Thin liquids spill to the pyriforms for several seconds and UES only occasionally relaxes to allow small amounts of thins  through. Pt also with prominent cricopharyngeus. Pt constantly "hawked" residuals up throughout the assessment. Pt hypernasal, however palatal retraction noted during swallow. Pt reported significant discomfort/fears regarding choking with puree presentation and it was noted to remain in valleculae and pyriforms until he could expectorate.  Pt appears to protect his airway fairly well and could try small sips of water and other drinks if MD in agreement for comfort. It may cause increased residuals however and be too labor intensive for patient. Pt does not know if he has seen an ENT, however this should be considered if not done already. Consider ENT consult at [facility].      Patient will benefit from skilled therapeutic intervention in order to improve the following deficits and impairments:   Dysphagia, oropharyngeal phase    Recommendations/Treatment - 02/27/18 1843      Swallow Evaluation Recommendations   Recommended Consults  Consider ENT evaluation    SLP Diet Recommendations  NPO trials small sips thin    Medication Administration  Via alternative means    Supervision  Patient able to self feed         Problem List Patient Active Problem List   Diagnosis Date Noted  . Goals of care, counseling/discussion   . Palliative care encounter   . Failure to thrive in adult 02/15/2017  . Hypertension 02/15/2017  . Diabetes mellitus without complication (  HCC) 02/15/2017  . Hypercholesteremia 02/15/2017  . Stroke (HCC) 02/15/2017  . Volume depletion 02/15/2017   Thank you,  Havery Moros, CCC-SLP [phone]  [facility] 02/27/2018, 6:46 PM  [facility] [address] [place], [place], [address] Phone: [phone]   Fax:  [phone]  Name: TREJAN BUDA MRN: [id number] Date of Birth: December 10, 1960

## 2018-04-01 ENCOUNTER — Emergency Department (HOSPITAL_COMMUNITY): Payer: Medicaid Other

## 2018-04-01 ENCOUNTER — Encounter (HOSPITAL_COMMUNITY): Payer: Self-pay | Admitting: *Deleted

## 2018-04-01 ENCOUNTER — Other Ambulatory Visit: Payer: Self-pay

## 2018-04-01 ENCOUNTER — Inpatient Hospital Stay (HOSPITAL_COMMUNITY)
Admission: EM | Admit: 2018-04-01 | Discharge: 2018-04-04 | DRG: 871 | Disposition: A | Discharge status: Skilled Nursing Facility | Payer: Medicaid Other | Attending: Internal Medicine | Admitting: Internal Medicine

## 2018-04-01 DIAGNOSIS — R0602 Shortness of breath: Secondary | ICD-10-CM

## 2018-04-01 DIAGNOSIS — I493 Ventricular premature depolarization: Secondary | ICD-10-CM | POA: Diagnosis present

## 2018-04-01 DIAGNOSIS — R197 Diarrhea, unspecified: Secondary | ICD-10-CM | POA: Diagnosis present

## 2018-04-01 DIAGNOSIS — I69321 Dysphasia following cerebral infarction: Secondary | ICD-10-CM | POA: Diagnosis not present

## 2018-04-01 DIAGNOSIS — F988 Other specified behavioral and emotional disorders with onset usually occurring in childhood and adolescence: Secondary | ICD-10-CM | POA: Diagnosis not present

## 2018-04-01 DIAGNOSIS — J690 Pneumonitis due to inhalation of food and vomit: Secondary | ICD-10-CM | POA: Diagnosis present

## 2018-04-01 DIAGNOSIS — Z7189 Other specified counseling: Secondary | ICD-10-CM

## 2018-04-01 DIAGNOSIS — R040 Epistaxis: Secondary | ICD-10-CM | POA: Diagnosis not present

## 2018-04-01 DIAGNOSIS — I10 Essential (primary) hypertension: Secondary | ICD-10-CM | POA: Diagnosis present

## 2018-04-01 DIAGNOSIS — D62 Acute posthemorrhagic anemia: Secondary | ICD-10-CM | POA: Diagnosis not present

## 2018-04-01 DIAGNOSIS — S0992XA Unspecified injury of nose, initial encounter: Secondary | ICD-10-CM | POA: Diagnosis not present

## 2018-04-01 DIAGNOSIS — Z515 Encounter for palliative care: Secondary | ICD-10-CM | POA: Diagnosis present

## 2018-04-01 DIAGNOSIS — I639 Cerebral infarction, unspecified: Secondary | ICD-10-CM | POA: Diagnosis present

## 2018-04-01 DIAGNOSIS — E119 Type 2 diabetes mellitus without complications: Secondary | ICD-10-CM

## 2018-04-01 DIAGNOSIS — R5381 Other malaise: Secondary | ICD-10-CM | POA: Diagnosis present

## 2018-04-01 DIAGNOSIS — I69354 Hemiplegia and hemiparesis following cerebral infarction affecting left non-dominant side: Secondary | ICD-10-CM | POA: Diagnosis not present

## 2018-04-01 DIAGNOSIS — R627 Adult failure to thrive: Secondary | ICD-10-CM | POA: Diagnosis present

## 2018-04-01 DIAGNOSIS — Z7902 Long term (current) use of antithrombotics/antiplatelets: Secondary | ICD-10-CM

## 2018-04-01 DIAGNOSIS — E7800 Pure hypercholesterolemia, unspecified: Secondary | ICD-10-CM | POA: Diagnosis present

## 2018-04-01 DIAGNOSIS — E43 Unspecified severe protein-calorie malnutrition: Secondary | ICD-10-CM | POA: Diagnosis present

## 2018-04-01 DIAGNOSIS — Z931 Gastrostomy status: Secondary | ICD-10-CM

## 2018-04-01 DIAGNOSIS — X58XXXA Exposure to other specified factors, initial encounter: Secondary | ICD-10-CM | POA: Diagnosis not present

## 2018-04-01 DIAGNOSIS — A419 Sepsis, unspecified organism: Secondary | ICD-10-CM | POA: Diagnosis present

## 2018-04-01 DIAGNOSIS — T17818A Gastric contents in other parts of respiratory tract causing other injury, initial encounter: Secondary | ICD-10-CM | POA: Diagnosis present

## 2018-04-01 DIAGNOSIS — L0390 Cellulitis, unspecified: Secondary | ICD-10-CM | POA: Diagnosis present

## 2018-04-01 DIAGNOSIS — R509 Fever, unspecified: Secondary | ICD-10-CM

## 2018-04-01 DIAGNOSIS — R6520 Severe sepsis without septic shock: Secondary | ICD-10-CM | POA: Diagnosis present

## 2018-04-01 DIAGNOSIS — R0902 Hypoxemia: Secondary | ICD-10-CM

## 2018-04-01 DIAGNOSIS — Z681 Body mass index (BMI) 19 or less, adult: Secondary | ICD-10-CM | POA: Diagnosis not present

## 2018-04-01 DIAGNOSIS — E785 Hyperlipidemia, unspecified: Secondary | ICD-10-CM | POA: Diagnosis present

## 2018-04-01 DIAGNOSIS — N170 Acute kidney failure with tubular necrosis: Secondary | ICD-10-CM | POA: Diagnosis present

## 2018-04-01 DIAGNOSIS — Z7982 Long term (current) use of aspirin: Secondary | ICD-10-CM

## 2018-04-01 DIAGNOSIS — Z79899 Other long term (current) drug therapy: Secondary | ICD-10-CM

## 2018-04-01 DIAGNOSIS — Z8679 Personal history of other diseases of the circulatory system: Secondary | ICD-10-CM

## 2018-04-01 HISTORY — DX: Dysphagia, unspecified: R13.10

## 2018-04-01 LAB — CBC WITH DIFFERENTIAL/PLATELET
Basophils Absolute: 0 10*3/uL (ref 0.0–0.1)
Basophils Relative: 0 %
Eosinophils Absolute: 0 10*3/uL (ref 0.0–0.7)
Eosinophils Relative: 0 %
HCT: 33.4 % — ABNORMAL LOW (ref 39.0–52.0)
HEMOGLOBIN: 10.4 g/dL — AB (ref 13.0–17.0)
LYMPHS PCT: 2 %
Lymphs Abs: 0.4 10*3/uL — ABNORMAL LOW (ref 0.7–4.0)
MCH: 27.1 pg (ref 26.0–34.0)
MCHC: 31.1 g/dL (ref 30.0–36.0)
MCV: 87 fL (ref 78.0–100.0)
Monocytes Absolute: 0.8 10*3/uL (ref 0.1–1.0)
Monocytes Relative: 4 %
Neutro Abs: 20 10*3/uL — ABNORMAL HIGH (ref 1.7–7.7)
Neutrophils Relative %: 94 %
Platelets: 443 10*3/uL — ABNORMAL HIGH (ref 150–400)
RBC: 3.84 MIL/uL — ABNORMAL LOW (ref 4.22–5.81)
RDW: 14.3 % (ref 11.5–15.5)
WBC Morphology: INCREASED
WBC: 21.2 10*3/uL — ABNORMAL HIGH (ref 4.0–10.5)

## 2018-04-01 LAB — COMPREHENSIVE METABOLIC PANEL
ALK PHOS: 97 U/L (ref 38–126)
ALT: 30 U/L (ref 0–44)
AST: 33 U/L (ref 15–41)
Albumin: 3.1 g/dL — ABNORMAL LOW (ref 3.5–5.0)
Anion gap: 12 (ref 5–15)
BUN: 39 mg/dL — ABNORMAL HIGH (ref 6–20)
CO2: 28 mmol/L (ref 22–32)
Calcium: 9.5 mg/dL (ref 8.9–10.3)
Chloride: 98 mmol/L (ref 98–111)
Creatinine, Ser: 1.59 mg/dL — ABNORMAL HIGH (ref 0.61–1.24)
GFR calc Af Amer: 55 mL/min — ABNORMAL LOW (ref >60)
GFR calc non Af Amer: 48 mL/min — ABNORMAL LOW (ref >60)
GLUCOSE: 137 mg/dL — AB (ref 70–99)
Potassium: 4.3 mmol/L (ref 3.5–5.1)
Sodium: 138 mmol/L (ref 135–145)
Total Bilirubin: 0.6 mg/dL (ref 0.3–1.2)
Total Protein: 7.3 g/dL (ref 6.5–8.1)

## 2018-04-01 LAB — GLUCOSE, CAPILLARY
GLUCOSE-CAPILLARY: 123 mg/dL — AB (ref 70–99)
Glucose-Capillary: 100 mg/dL — ABNORMAL HIGH (ref 70–99)

## 2018-04-01 LAB — MRSA PCR SCREENING: MRSA by PCR: POSITIVE — AB

## 2018-04-01 LAB — LACTIC ACID, PLASMA
Lactic Acid, Venous: 2 mmol/L (ref 0.5–1.9)
Lactic Acid, Venous: 1.3 mmol/L (ref 0.5–1.9)

## 2018-04-01 LAB — TROPONIN I: Troponin I: <0.03 ng/mL (ref <0.03)

## 2018-04-01 MED ORDER — SODIUM CHLORIDE 0.9 % IV BOLUS
1000.0000 mL | Freq: Once | INTRAVENOUS | Status: AC
  Administered 2018-04-01: 1000 mL via INTRAVENOUS

## 2018-04-01 MED ORDER — ENOXAPARIN SODIUM 40 MG/0.4ML ~~LOC~~ SOLN
40.0000 mg | SUBCUTANEOUS | Status: DC
  Administered 2018-04-01: 40 mg via SUBCUTANEOUS
  Filled 2018-04-01: qty 0.4

## 2018-04-01 MED ORDER — MUPIROCIN 2 % EX OINT
1.0000 "application " | TOPICAL_OINTMENT | Freq: Two times a day (BID) | CUTANEOUS | Status: DC
  Administered 2018-04-01 – 2018-04-04 (×6): 1 via NASAL
  Filled 2018-04-01 (×2): qty 22

## 2018-04-01 MED ORDER — FERROUS SULFATE 300 (60 FE) MG/5ML PO SYRP
300.0000 mg | ORAL_SOLUTION | Freq: Three times a day (TID) | ORAL | Status: DC
  Administered 2018-04-02 – 2018-04-04 (×6): 300 mg via ORAL
  Filled 2018-04-01 (×16): qty 5

## 2018-04-01 MED ORDER — HYDROXYZINE HCL 25 MG PO TABS: 25.0000 mg | ORAL_TABLET | Freq: Three times a day (TID) | ORAL | Status: DC | PRN

## 2018-04-01 MED ORDER — SODIUM CHLORIDE 0.9 % IV BOLUS: 1000.0000 mL | Freq: Once | INTRAVENOUS | Status: DC

## 2018-04-01 MED ORDER — ACETAMINOPHEN 650 MG RE SUPP
650.0000 mg | Freq: Once | RECTAL | Status: AC
  Administered 2018-04-01: 650 mg via RECTAL
  Filled 2018-04-01: qty 1

## 2018-04-01 MED ORDER — INSULIN ASPART 100 UNIT/ML ~~LOC~~ SOLN
0.0000 [IU] | Freq: Three times a day (TID) | SUBCUTANEOUS | Status: DC
  Administered 2018-04-01: 1 [IU] via SUBCUTANEOUS

## 2018-04-01 MED ORDER — ERYTHROMYCIN 5 MG/GM OP OINT
1.0000 "application " | TOPICAL_OINTMENT | Freq: Two times a day (BID) | OPHTHALMIC | Status: DC
  Administered 2018-04-01 – 2018-04-04 (×7): 1 via OPHTHALMIC
  Filled 2018-04-01: qty 3.5

## 2018-04-01 MED ORDER — PIPERACILLIN-TAZOBACTAM 3.375 G IVPB 30 MIN
3.3750 g | Freq: Once | INTRAVENOUS | Status: AC
  Administered 2018-04-01: 3.375 g via INTRAVENOUS
  Filled 2018-04-01: qty 50

## 2018-04-01 MED ORDER — SERTRALINE HCL 50 MG PO TABS
100.0000 mg | ORAL_TABLET | Freq: Every day | ORAL | Status: DC
  Administered 2018-04-01 – 2018-04-04 (×4): 100 mg
  Filled 2018-04-01 (×4): qty 2

## 2018-04-01 MED ORDER — ONDANSETRON HCL 4 MG/2ML IJ SOLN
4.0000 mg | Freq: Four times a day (QID) | INTRAMUSCULAR | Status: DC | PRN
  Administered 2018-04-01: 4 mg via INTRAVENOUS
  Filled 2018-04-01 (×2): qty 2

## 2018-04-01 MED ORDER — SODIUM CHLORIDE 0.9 % IV SOLN
3.0000 g | Freq: Three times a day (TID) | INTRAVENOUS | Status: DC
  Administered 2018-04-01 – 2018-04-04 (×8): 3 g via INTRAVENOUS
  Filled 2018-04-01 (×14): qty 3

## 2018-04-01 MED ORDER — DOCUSATE SODIUM 50 MG/5ML PO LIQD
5.0000 mL | Freq: Two times a day (BID) | ORAL | Status: DC
  Administered 2018-04-01 – 2018-04-02 (×2): 50 mg
  Filled 2018-04-01 (×10): qty 10

## 2018-04-01 MED ORDER — ATORVASTATIN CALCIUM 10 MG PO TABS
10.0000 mg | ORAL_TABLET | Freq: Every day | ORAL | Status: DC
  Administered 2018-04-01 – 2018-04-03 (×3): 10 mg
  Filled 2018-04-01 (×3): qty 1

## 2018-04-01 MED ORDER — ACETAMINOPHEN 650 MG RE SUPP
650.0000 mg | Freq: Four times a day (QID) | RECTAL | Status: DC | PRN
  Filled 2018-04-01: qty 1

## 2018-04-01 MED ORDER — AMLODIPINE BESYLATE 5 MG PO TABS
10.0000 mg | ORAL_TABLET | Freq: Every day | ORAL | Status: DC
  Administered 2018-04-02 – 2018-04-04 (×3): 10 mg
  Filled 2018-04-01 (×3): qty 2

## 2018-04-01 MED ORDER — CHLORHEXIDINE GLUCONATE CLOTH 2 % EX PADS
6.0000 | MEDICATED_PAD | Freq: Every day | CUTANEOUS | Status: DC
  Administered 2018-04-02 – 2018-04-04 (×3): 6 via TOPICAL

## 2018-04-01 MED ORDER — LABETALOL HCL 200 MG PO TABS
200.0000 mg | ORAL_TABLET | Freq: Two times a day (BID) | ORAL | Status: DC
  Administered 2018-04-01 – 2018-04-04 (×7): 200 mg
  Filled 2018-04-01 (×7): qty 1

## 2018-04-01 MED ORDER — INSULIN ASPART 100 UNIT/ML ~~LOC~~ SOLN: 0.0000 [IU] | Freq: Every day | SUBCUTANEOUS | Status: DC

## 2018-04-01 MED ORDER — VANCOMYCIN HCL IN DEXTROSE 1-5 GM/200ML-% IV SOLN
1000.0000 mg | Freq: Once | INTRAVENOUS | Status: AC
  Administered 2018-04-01: 1000 mg via INTRAVENOUS
  Filled 2018-04-01: qty 200

## 2018-04-01 MED ORDER — ADULT MULTIVITAMIN LIQUID CH
5.0000 mL | Freq: Every day | ORAL | Status: DC
  Administered 2018-04-02 – 2018-04-04 (×3): 5 mL via ORAL
  Filled 2018-04-01 (×5): qty 15

## 2018-04-01 MED ORDER — VITAMIN C 500 MG PO TABS
500.0000 mg | ORAL_TABLET | Freq: Two times a day (BID) | ORAL | Status: DC
  Administered 2018-04-01 – 2018-04-04 (×6): 500 mg
  Filled 2018-04-01 (×6): qty 1

## 2018-04-01 MED ORDER — PANTOPRAZOLE SODIUM 40 MG PO TBEC
40.0000 mg | DELAYED_RELEASE_TABLET | Freq: Every day | ORAL | Status: DC
  Administered 2018-04-01 – 2018-04-04 (×4): 40 mg via ORAL
  Filled 2018-04-01 (×4): qty 1

## 2018-04-01 MED ORDER — CLOPIDOGREL BISULFATE 75 MG PO TABS
75.0000 mg | ORAL_TABLET | Freq: Every day | ORAL | Status: DC
  Administered 2018-04-01 – 2018-04-04 (×4): 75 mg
  Filled 2018-04-01 (×4): qty 1

## 2018-04-01 MED ORDER — INSULIN ASPART 100 UNIT/ML ~~LOC~~ SOLN
3.0000 [IU] | Freq: Three times a day (TID) | SUBCUTANEOUS | Status: DC
  Administered 2018-04-02 – 2018-04-03 (×3): 3 [IU] via SUBCUTANEOUS

## 2018-04-01 MED ORDER — SODIUM CHLORIDE 0.9 % IV BOLUS: 716.0000 mL | Freq: Once | INTRAVENOUS | Status: DC

## 2018-04-01 MED ORDER — SODIUM CHLORIDE 0.9 % IV SOLN
1000.0000 mL | INTRAVENOUS | Status: DC
  Administered 2018-04-01: 1000 mL via INTRAVENOUS

## 2018-04-01 MED ORDER — IPRATROPIUM-ALBUTEROL 0.5-2.5 (3) MG/3ML IN SOLN
3.0000 mL | Freq: Once | RESPIRATORY_TRACT | Status: AC
  Administered 2018-04-01: 3 mL via RESPIRATORY_TRACT
  Filled 2018-04-01: qty 3

## 2018-04-01 MED ORDER — BISACODYL 10 MG RE SUPP: 10.0000 mg | Freq: Every day | RECTAL | Status: DC | PRN

## 2018-04-01 MED ORDER — FLUTICASONE PROPIONATE 50 MCG/ACT NA SUSP
1.0000 | Freq: Every day | NASAL | Status: DC
  Administered 2018-04-02 – 2018-04-04 (×3): 1 via NASAL
  Filled 2018-04-01: qty 16

## 2018-04-01 MED ORDER — SODIUM CHLORIDE 0.9 % IV SOLN
INTRAVENOUS | Status: DC
  Administered 2018-04-02 – 2018-04-03 (×3): via INTRAVENOUS

## 2018-04-01 NOTE — ED Triage Notes (Signed)
[facility] here for possible aspiration, low oxygen level on ra.  Fever, ST 125 per report

## 2018-04-01 NOTE — Progress Notes (Signed)
Pharmacy Antibiotic Note  Ronald Ortiz is a 57 y.o. male admitted on 04/01/2018 with aspiration pneumonia.  Pharmacy has been consulted for unasyn dosing.  Plan: Unasyn 3gm IV q8h F/u cxs and clinical progress Monitor V/S, labs  Height: 6\' 1"  (185.4 cm) Weight: 126 lb (57.2 kg) IBW/kg (Calculated) : 79.9  Temp (24hrs), Avg:102.8 F (39.3 C), Min:102.8 F (39.3 C), Max:102.8 F (39.3 C)  Recent Labs  Lab 04/01/18 0941 04/01/18 0944 04/01/18 1148  WBC 21.2*  --   --   CREATININE 1.59*  --   --   LATICACIDVEN  --  2.0* 1.3    Estimated Creatinine Clearance: 43 mL/min (A) (by C-G formula based on SCr of 1.59 mg/dL (H)).    No Known Allergies  Antimicrobials this admission: Unasyn 7/28 >>  Zosyn x 1 dose and Vancomycin x 1 dose in ED 7/28  Dose adjustments this admission: [name]  Microbiology results: 7/28 BCx: pending  UCx:  MRSA PCR:  Thank you for allowing pharmacy to be a part of this patient's care.  Elder CyphersLorie Khila Papp, BS Loura Backharm D, [place] Clinical Pharmacist Pager [phone] 04/01/2018 12:25 PM

## 2018-04-01 NOTE — ED Provider Notes (Signed)
[facility]GENCY DEPARTMENT Provider Note   CSN: [id number] Arrival date & time: 04/01/18  0932     History   Chief Complaint Chief Complaint  Patient presents with  . Shortness of Breath    HPI Ronald Ortiz is a 57 y.o. male.  HPI  Pt was seen at 0950. Per EMS, NH report and pt:  Pt c/o gradual onset and persistence of constant "cough" that began yesterday following an episode of N/V. Has been associated with fever and "low oxygen level on R/A" per report. Pt denies any other complaints. Denies CP/palpitations, no abd pain, no further N/V, no diarrhea, no new focal motor weakness, no neck or back pain, no sore throat, no rash.     Past Medical History:  Diagnosis Date  . CHF (congestive heart failure) (HCC)   . Diabetes mellitus without complication (HCC)   . Dysphagia   . Hypercholesteremia   . Hypertension   . Renal disorder   . Stroke [facility](HCC)    feb 14th, 2018; residual left sided weakness    Patient Active Problem List   Diagnosis Date Noted  . Goals of care, counseling/discussion   . Palliative care encounter   . Failure to thrive in adult 02/15/2017  . Hypertension 02/15/2017  . Diabetes mellitus without complication (HCC) 02/15/2017  . Hypercholesteremia 02/15/2017  . Stroke (HCC) 02/15/2017  . Volume depletion 02/15/2017    Past Surgical History:  Procedure Laterality Date  . PEG TUBE PLACEMENT          Home Medications    Prior to Admission medications   Medication Sig Start Date End Date Taking? Authorizing Provider  amLODipine (NORVASC) 10 MG tablet Place 10 mg into feeding tube daily.    [provider]  aspirin 325 MG tablet Place 325 mg into feeding tube daily.    [provider]  atorvastatin (LIPITOR) 80 MG tablet Place 80 mg into feeding tube at bedtime.    [provider]  clopidogrel (PLAVIX) 75 MG tablet Place 75 mg into feeding tube daily.    [provider]  docusate sodium (COLACE) 100 MG  capsule 100 mg 2 (two) times daily as needed for mild constipation. Pt takes via peg tube.    [provider]  labetalol (NORMODYNE) 200 MG tablet Place 400 mg into feeding tube 2 (two) times daily.    [provider]  metoCLOPramide (REGLAN) 5 MG/5ML solution Place 5 mg into feeding tube 4 (four) times daily.    [provider]  mirtazapine (REMERON) 15 MG tablet Take 15 mg by mouth at bedtime.    [provider]  polyvinyl alcohol (LIQUIFILM TEARS) 1.4 % ophthalmic solution Place 1 drop into both eyes every 6 (six) hours.    [provider]  ranitidine (ZANTAC) 150 MG tablet Take 150 mg by mouth 2 (two) times daily.    [provider]    Family History Family History  Problem Relation Age of Onset  . Hypertension Mother     Social History Social History   Tobacco Use  . Smoking status: Never Smoker  . Smokeless tobacco: Never Used  Substance Use Topics  . Alcohol use: No  . Drug use: No     Allergies   Patient has no known allergies.   Review of Systems Review of Systems ROS: Statement: All systems negative except as marked or noted in the HPI; Constitutional: +fever and chills. ; ; Eyes: Negative for eye pain, redness and  discharge. ; ; ENMT: Negative for ear pain, hoarseness, nasal congestion, sinus pressure and sore throat. ; ; Cardiovascular: Negative for chest pain, palpitations, diaphoresis, dyspnea and peripheral edema. ; ; Respiratory: +cough. Negative for wheezing and stridor. ; ; Gastrointestinal: +N/V yesterday. Negative for diarrhea, abdominal pain, blood in stool, hematemesis, jaundice and rectal bleeding. . ; ; Genitourinary: Negative for dysuria, flank pain and hematuria. ; ; Musculoskeletal: Negative for back pain and neck pain. Negative for swelling and trauma.; ; Skin: Negative for pruritus, rash, abrasions, blisters, bruising and skin lesion.; ; Neuro: Negative for headache, lightheadedness and neck stiffness.  Negative for weakness, altered level of consciousness, altered mental status, extremity weakness, paresthesias, involuntary movement, seizure and syncope.       Physical Exam Updated Vital Signs BP 101/81   Pulse (!) 123   Temp (!) 102.8 F (39.3 C) (Oral)   Resp 19   Ht 6\' 1"  (1.854 m)   Wt 57.2 kg (126 lb)   SpO2 97%   BMI 16.62 kg/m    Patient Vitals for the past 24 hrs:  BP Temp Temp src Pulse Resp SpO2 Height Weight  04/01/18 1015 - - - (!) 123 - 97 % - -  04/01/18 1000 101/81 - - (!) 126 19 94 % - -  04/01/18 0947 - - - (!) 131 19 94 % - -  04/01/18 0933 105/73 (!) 102.8 F (39.3 C) Oral (!) 130 (!) 22 93 % - -  04/01/18 0931 - (!) 102.8 F (39.3 C) Rectal - - - 6\' 1"  (1.854 m) 57.2 kg (126 lb)     Physical Exam 0955: Physical examination:  Nursing notes reviewed; Vital signs and O2 SAT reviewed;  Constitutional:  Thin, frail.  In no acute distress; Head:  Normocephalic, atraumatic; Eyes: EOMI, PERRL, No scleral icterus; ENMT: Mouth and pharynx normal, Mucous membranes dry; Neck: Supple, Full range of motion, No lymphadenopathy; Cardiovascular:  Tachycardic rate and rhythm, No gallop; Respiratory: Breath sounds coarse & equal bilaterally, No wheezes.  Speaking full sentences with ease, Normal respiratory effort/excursion; Chest: Nontender, Movement normal; Abdomen: Soft, Nontender, Nondistended, Normal bowel sounds; Genitourinary: No CVA tenderness; Extremities: Peripheral pulses normal, No tenderness, No edema, No calf edema or asymmetry.; Neuro: AA&Ox3,  Speech clear. +left sided weakness per hx, otherwise no new gross focal motor deficits in extremities.; Skin: Color normal, Warm, Dry.   ED Treatments / Results  Labs (all labs ordered are listed, but only abnormal results are displayed)   EKG EKG Interpretation  Date/Time:  Sunday April 01 2018 09:32:20 EDT Ventricular Rate:  130 PR Interval:    QRS Duration: 76 QT Interval:  330 QTC Calculation: 486 R  Axis:   62 Text Interpretation:  Sinus tachycardia Ventricular premature complex Aberrant complex Borderline prolonged QT interval Baseline wander When compared with ECG of 11/29/2016 Rate faster and QT has lengthened Confirmed by Samuel Jester [phone]) on 04/01/2018 10:11:25 AM   Radiology   Procedures Procedures (including critical care time)  Medications Ordered in ED Medications  0.9 %  sodium chloride infusion (has no administration in time range)  piperacillin-tazobactam (ZOSYN) IVPB 3.375 g (has no administration in time range)  vancomycin (VANCOCIN) IVPB 1000 mg/200 mL premix (has no administration in time range)  acetaminophen (TYLENOL) suppository 650 mg (has no administration in time range)  sodium chloride 0.9 % bolus 1,000 mL (has no administration in time range)  ipratropium-albuterol (DUONEB) 0.5-2.5 (3) MG/3ML nebulizer solution 3 mL (has no administration in time range)  Initial Impression / Assessment and Plan / ED Course  I have reviewed the triage vital signs and the nursing notes.  Pertinent labs & imaging results that were available during my care of the patient were reviewed by me and considered in my medical decision making (see chart for details).  MDM Reviewed: previous chart, nursing note and vitals Reviewed previous: labs and ECG Interpretation: labs, ECG and x-ray Total time providing critical care: 30-74 minutes. This excludes time spent performing separately reportable procedures and services. Consults: admitting MD   CRITICAL CARE Performed by: Laray Anger Total critical care time: 35 minutes Critical care time was exclusive of separately billable procedures and treating other patients. Critical care was necessary to treat or prevent imminent or life-threatening deterioration. Critical care was time spent personally by me on the following activities: development of treatment plan with patient and/or surrogate as well as nursing,  discussions with consultants, evaluation of patient's response to treatment, examination of patient, obtaining history from patient or surrogate, ordering and performing treatments and interventions, ordering and review of laboratory studies, ordering and review of radiographic studies, pulse oximetry and re-evaluation of patient's condition.   Results for orders placed or performed during the hospital encounter of 04/01/18  Blood Culture (routine x 2)  Result Value Ref Range   Specimen Description BLOOD RIGHT WRIST    Special Requests      BOTTLES DRAWN AEROBIC AND ANAEROBIC Blood Culture adequate volume Performed at [facility], [address] [place], [place] [address]    Culture PENDING    Report Status PENDING   Blood Culture (routine x 2)  Result Value Ref Range   Specimen Description RIGHT ANTECUBITAL    Special Requests      BOTTLES DRAWN AEROBIC AND ANAEROBIC Blood Culture adequate volume Performed at [facility], [address], [place], [place] [address]    Culture PENDING    Report Status PENDING   Comprehensive metabolic panel  Result Value Ref Range   Sodium 138 135 - 145 mmol/L   Potassium 4.3 3.5 - 5.1 mmol/L   Chloride 98 98 - 111 mmol/L   CO2 28 22 - 32 mmol/L   Glucose, Bld 137 (H) 70 - 99 mg/dL   BUN 39 (H) 6 - 20 mg/dL   Creatinine, Ser 1.47 (H) 0.61 - 1.24 mg/dL   Calcium 9.5 8.9 - 82.9 mg/dL   Total Protein 7.3 6.5 - 8.1 g/dL   Albumin 3.1 (L) 3.5 - 5.0 g/dL   AST 33 15 - 41 U/L   ALT 30 0 - 44 U/L   Alkaline Phosphatase 97 38 - 126 U/L   Total Bilirubin 0.6 0.3 - 1.2 mg/dL   GFR calc non Af Amer 48 (L) >60 mL/min   GFR calc Af Amer 55 (L) >60 mL/min   Anion gap 12 5 - 15  CBC WITH DIFFERENTIAL  Result Value Ref Range   WBC 21.2 (H) 4.0 - 10.5 K/uL   RBC 3.84 (L) 4.22 - 5.81 MIL/uL   Hemoglobin 10.4 (L) 13.0 - 17.0 g/dL   HCT 56.2 (L) 13.0 - 86.5 %   MCV 87.0 78.0 - 100.0 fL   MCH 27.1 26.0 - 34.0 pg   MCHC 31.1 30.0 - 36.0 g/dL   RDW 78.4  69.6 - 29.5 %   Platelets 443 (H) 150 - 400 K/uL   Neutrophils Relative % 94 %   Lymphocytes Relative 2 %   Monocytes Relative 4 %   Eosinophils Relative 0 %  Basophils Relative 0 %   Neutro Abs 20.0 (H) 1.7 - 7.7 K/uL   Lymphs Abs 0.4 (L) 0.7 - 4.0 K/uL   Monocytes Absolute 0.8 0.1 - 1.0 K/uL   Eosinophils Absolute 0.0 0.0 - 0.7 K/uL   Basophils Absolute 0.0 0.0 - 0.1 K/uL   WBC Morphology INCREASED BANDS (>20% BANDS)   Lactic acid, plasma  Result Value Ref Range   Lactic Acid, Venous 2.0 (HH) 0.5 - 1.9 mmol/L  Troponin I  Result Value Ref Range   Troponin I <0.03 <0.03 ng/mL   Dg Chest Port 1 View Result Date: 04/01/2018 CLINICAL DATA:  Acute shortness of breath.  Possible aspiration. EXAM: PORTABLE CHEST 1 VIEW COMPARISON:  02/14/2018 chest radiograph FINDINGS: LEFT LOWER lung airspace disease is compatible with pneumonia or aspiration. RIGHT lung is clear. The cardiomediastinal silhouette is unremarkable. No pleural effusion or pneumothorax. No acute bony abnormalities identified. IMPRESSION: LEFT LOWER lung airspace disease compatible with pneumonia or aspiration. Electronically Signed   By: Harmon Pier M.D.   On: 04/01/2018 10:09    Results for BALDO, HUFNAGLE (MRN [id number]) as of 04/01/2018 10:33  Ref. Range 02/15/2017 19:26 02/16/2017 04:02 02/17/2017 04:58 02/14/2018 04:53 04/01/2018 09:41  BUN Latest Ref Range: 6 - 20 mg/dL 55 (H) 48 (H) 27 (H) 28 (H) 39 (H)  Creatinine Latest Ref Range: 0.61 - 1.24 mg/dL 4.09 (H) 8.11 9.14 7.82 1.59 (H)    1115:  Code Sepsis called on pt's arrival. IV abx started after [facility] obtained. Pt with scant urine in bladder. IVF bolus ordered. APAP given for fever. Short neb given for moist cough. Pt states he "feels better after neb" and Sats have improved from 91% on O2 1L N/C. to 97%.  Tachycardia slowly improving with interventions. Dx and testing d/w pt.  Questions answered.  Verb understanding, agreeable to admit. T/C returned from [facility]dy[facility] Har[facility],  case discussed, including:  HPI, pertinent PM/SHx, VS/PE, dx testing, ED course and treatment:  Agreeable to admit.     Final Clinical Impressions(s) / ED Diagnoses   Final diagnoses:  None    ED Discharge Orders    None       Samuel Jester, DO 04/04/18 2117

## 2018-04-01 NOTE — ED Notes (Signed)
In and out attempted with no urine return. Bladder scan showed only 46mL.

## 2018-04-01 NOTE — H&P (Signed)
History and Physical    Ronald PesaLarry K Ortiz WUJ:[id number] DOB: 1960/10/29 DOA: 04/01/2018  Referring MD/NP/PA: Samuel JesterKathleen McManus, EDP PCP: Bernerd LimboAriza, Fernando Enrique, MD  Patient coming from: Skilled nursing facility  Chief Complaint: Cough, fever  HPI: Ronald PesaLarry K Stoke is a 57 y.o. male brought in from skilled nursing facility due to cough and fever.  Patient states he had some nausea and vomiting over the past 2 days and later developed a constant cough.  Has been hypoxemic requiring oxygen which is new for him.  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 In the Ed was found to have a temp of 102.8, lactic acid 2.0, Cr 1.59, WBCs 21.2,  CXR with LLL ASD. Admission is requested for management of presumed aspiration PNA.  Past Medical/Surgical History: Past Medical History:  Diagnosis Date  . CHF (congestive heart failure) (HCC)   . Diabetes mellitus without complication (HCC)   . Dysphagia   . Hypercholesteremia   . Hypertension   . Renal disorder   . Stroke [facility](HCC)    feb 14th, 2018; residual left sided weakness    Past Surgical History:  Procedure Laterality Date  . PEG TUBE PLACEMENT      Social History:  reports that he has never smoked. He has never used smokeless tobacco. He reports that he does not drink alcohol or use drugs.  Allergies: No Known Allergies  Family History:  Family History  Problem Relation Age of Onset  . Hypertension Mother     Prior to Admission medications   Medication Sig Start Date End Date Taking? Authorizing Provider  amLODipine (NORVASC) 10 MG tablet Place 10 mg into feeding tube daily.   Yes [provider]  atorvastatin (LIPITOR) 10 MG tablet Place 10 mg into feeding tube at bedtime.   Yes [provider]  clopidogrel (PLAVIX) 75 MG tablet Place 75 mg into feeding tube daily.   Yes [provider]  Docusate Sodium 50 MG/15ML LIQD Place 10 mLs into feeding tube 2  (two) times daily.   Yes [provider]  erythromycin ophthalmic ointment Place 1 application into the right eye 2 (two) times daily.   Yes [provider]  ferrous sulfate 220 (44 Fe) MG/5ML solution Take 308 mg by mouth 3 (three) times daily with meals. Give 7 mls with meals.   Yes [provider]  fluticasone (FLONASE) 50 MCG/ACT nasal spray Place 1 spray into both nostrils daily.   Yes [provider]  hydrOXYzine (ATARAX/VISTARIL) 25 MG tablet Place 25 mg into feeding tube every 8 (eight) hours as needed for itching.   Yes [provider]  labetalol (NORMODYNE) 200 MG tablet Place 200 mg into feeding tube 2 (two) times daily.    Yes [provider]  Multiple Vitamin (MULTIVITAMIN) LIQD Take 5 mLs by mouth daily.   Yes [provider]  omeprazole (PRILOSEC) 20 MG capsule Take 20 mg by mouth daily.   Yes [provider]  sertraline (ZOLOFT) 100 MG tablet Place 100 mg into feeding tube daily.   Yes [provider]  vitamin C (ASCORBIC ACID) 500 MG tablet Place 500 mg into feeding tube 2 (two) times daily.   Yes [provider]  white petrolatum ointment Apply 1 application topically daily. Apply to lips   Yes [provider]  zinc sulfate 220 (50 Zn) MG capsule Place 220 mg into feeding tube daily.   Yes  [provider]    Review of Systems:  Only complaints are cough and fever, otherwise 13 point ROS is negative.   Physical Exam: Vitals:   04/01/18 1100 04/01/18 1130 04/01/18 1231 04/01/18 1257  BP: 100/88 101/66 108/72   Pulse: (!) 115 (!) 113 (!) 102   Resp: (!) 22 17 18    Temp:   98.2 F (36.8 C)   TempSrc:   Oral   SpO2: 95% 96% 94%   Weight:    57.2 kg (126 lb 1.7 oz)  Height:    6\' 1"  (1.854 m)     Constitutional: NAD, calm, comfortable Eyes: PERRL, lids and conjunctivae normal ENMT: Mucous membranes are moist. Posterior pharynx clear of any exudate or  lesions.Normal dentition.  Neck: normal, supple, no masses, no thyromegaly Respiratory: clear to auscultation bilaterally, no wheezing, no crackles. Normal respiratory effort. No accessory muscle use.  Cardiovascular: Regular rate and rhythm, no murmurs / rubs / gallops. No extremity edema. 2+ pedal pulses. No carotid bruits.  Abdomen: no tenderness, no masses palpated. No hepatosplenomegaly. Bowel sounds positive. Site of PEG is erythematous with some mild purulent drainage. Musculoskeletal: no clubbing / cyanosis. No joint deformity upper and lower extremities. Good ROM, no contractures. Normal muscle tone.  Skin: no rashes, lesions, ulcers. No induration Neurologic: CN 2-12 grossly intact. Sensation intact, DTR normal. Strength 5/5 in all 4.  Psychiatric: Normal judgment and insight. Alert and oriented x 3. Normal mood.    Labs on Admission: I have personally reviewed the following labs and imaging studies  CBC: Recent Labs  Lab 04/01/18 0941  WBC 21.2*  NEUTROABS 20.0*  HGB 10.4*  HCT 33.4*  MCV 87.0  PLT 443*   Basic Metabolic Panel: Recent Labs  Lab 04/01/18 0941  NA 138  K 4.3  CL 98  CO2 28  GLUCOSE 137*  BUN 39*  CREATININE 1.59*  CALCIUM 9.5   GFR: Estimated Creatinine Clearance: 43 mL/min (A) (by C-G formula based on SCr of 1.59 mg/dL (H)). Liver Function Tests: Recent Labs  Lab 04/01/18 0941  AST 33  ALT 30  ALKPHOS 97  BILITOT 0.6  PROT 7.3  ALBUMIN 3.1*   No results for input(s): LIPASE, AMYLASE in the last 168 hours. No results for input(s): AMMONIA in the last 168 hours. Coagulation Profile: No results for input(s): INR, PROTIME in the last 168 hours. Cardiac Enzymes: Recent Labs  Lab 04/01/18 0944  TROPONINI <0.03   BNP (last 3 results) No results for input(s): PROBNP in the last 8760 hours. HbA1C: No results for input(s): HGBA1C in the last 72 hours. CBG: No results for input(s): GLUCAP in the last 168 hours. Lipid Profile: No  results for input(s): CHOL, HDL, LDLCALC, TRIG, CHOLHDL, LDLDIRECT in the last 72 hours. Thyroid Function Tests: No results for input(s): TSH, T4TOTAL, FREET4, T3FREE, THYROIDAB in the last 72 hours. Anemia Panel: No results for input(s): VITAMINB12, FOLATE, FERRITIN, TIBC, IRON, RETICCTPCT in the last 72 hours. Urine analysis:    Component Value Date/Time   COLORURINE YELLOW 02/15/2017 1910   APPEARANCEUR CLEAR 02/15/2017 1910   LABSPEC 1.025 02/15/2017 1910   PHURINE 5.0 02/15/2017 1910   GLUCOSEU NEGATIVE 02/15/2017 1910   HGBUR NEGATIVE 02/15/2017 1910   BILIRUBINUR NEGATIVE 02/15/2017 1910   KETONESUR 20 (A) 02/15/2017 1910   PROTEINUR NEGATIVE 02/15/2017 1910   NITRITE NEGATIVE 02/15/2017 1910   LEUKOCYTESUR NEGATIVE 02/15/2017 1910   Sepsis Labs: @LABRCNTIP (procalcitonin:4,lacticidven:4) ) Recent Results (from the past 240 hour(s))  Blood  Culture (routine x 2)     Status: None (Preliminary result)   Collection Time: 04/01/18  9:43 AM  Result Value Ref Range Status   Specimen Description BLOOD RIGHT WRIST  Final   Special Requests   Final    BOTTLES DRAWN AEROBIC AND ANAEROBIC Blood Culture adequate volume Performed at [facility], [address] [place], [place] [address]    Culture PENDING  Incomplete   Report Status PENDING  Incomplete  Blood Culture (routine x 2)     Status: None (Preliminary result)   Collection Time: 04/01/18  9:44 AM  Result Value Ref Range Status   Specimen Description RIGHT ANTECUBITAL  Final   Special Requests   Final    BOTTLES DRAWN AEROBIC AND ANAEROBIC Blood Culture adequate volume Performed at [facility], [address], [place], [place] [address]    Culture PENDING  Incomplete   Report Status PENDING  Incomplete     Radiological Exams on Admission: Dg Chest Port 1 View  Result Date: 04/01/2018 CLINICAL DATA:  Acute shortness of breath.  Possible aspiration. EXAM: PORTABLE CHEST 1 VIEW COMPARISON:  02/14/2018 chest radiograph  FINDINGS: LEFT LOWER lung airspace disease is compatible with pneumonia or aspiration. RIGHT lung is clear. The cardiomediastinal silhouette is unremarkable. No pleural effusion or pneumothorax. No acute bony abnormalities identified. IMPRESSION: LEFT LOWER lung airspace disease compatible with pneumonia or aspiration. Electronically Signed   By: Harmon Pier M.D.   On: 04/01/2018 10:09    EKG: Independently reviewed. Sinus tachycardia at a rate of 130. No acute ischemic changes.  Assessment/Plan Active Problems:   Failure to thrive in adult   Hypertension   Diabetes mellitus without complication (HCC)   Hypercholesteremia   Stroke (HCC)   Aspiration pneumonia (HCC)   ARF (acute renal failure) with tubular necrosis (HCC)    Aspiration Pneumonia -This is the most likely etiology given his h/o dysphagia and report of N/V prior to cough. -Start Unasyn, -Blood/sputum cx. -Strep pneumo/legionella urine antigens. -Continue PEG feedings.  Dysphagia -Following CVA. -PEG-tube dependent.  -?  DM -Check A1C. -Place on SSI.  ARF -Start IVF. -Renal renal function in am. -Likely due to prerenal azotemia.  Prior CVA -Noted.  Hyperlipidemia -Continue statin.   DVT prophylaxis: Lovenox  Code Status: Full code  Family Communication: patient only  Disposition Plan: Back to SNF pending medical stability  Consults called: None  Admission status: Admit - It is my clinical opinion that admission to INPATIENT is reasonable and necessary because of the expectation that this patient will require hospital care that crosses at least 2 midnights to treat this condition based on the medical complexity of the problems presented.  Given the aforementioned information, the predictability of an adverse outcome is felt to be significant.      Time Spent: 90 minutes  Estela Philip Aspen MD [facility] Pager [phone]  If 7PM-7AM, please contact  night-coverage [email or web address] Password [facility]  04/01/2018, 3:55 PM

## 2018-04-02 ENCOUNTER — Encounter (HOSPITAL_COMMUNITY): Payer: Self-pay | Admitting: Primary Care

## 2018-04-02 DIAGNOSIS — Z515 Encounter for palliative care: Secondary | ICD-10-CM

## 2018-04-02 LAB — CBC
HCT: 27 % — ABNORMAL LOW (ref 39.0–52.0)
HEMOGLOBIN: 8.2 g/dL — AB (ref 13.0–17.0)
MCH: 27.1 pg (ref 26.0–34.0)
MCHC: 30.4 g/dL (ref 30.0–36.0)
MCV: 89.1 fL (ref 78.0–100.0)
Platelets: 393 10*3/uL (ref 150–400)
RBC: 3.03 MIL/uL — AB (ref 4.22–5.81)
RDW: 14.9 % (ref 11.5–15.5)
WBC: 15.7 10*3/uL — ABNORMAL HIGH (ref 4.0–10.5)

## 2018-04-02 LAB — BASIC METABOLIC PANEL
ANION GAP: 7 (ref 5–15)
BUN: 37 mg/dL — ABNORMAL HIGH (ref 6–20)
CHLORIDE: 105 mmol/L (ref 98–111)
CO2: 29 mmol/L (ref 22–32)
Calcium: 8.8 mg/dL — ABNORMAL LOW (ref 8.9–10.3)
Creatinine, Ser: 1.3 mg/dL — ABNORMAL HIGH (ref 0.61–1.24)
GFR calc non Af Amer: >60 mL/min (ref >60)
Glucose, Bld: 118 mg/dL — ABNORMAL HIGH (ref 70–99)
Potassium: 4.3 mmol/L (ref 3.5–5.1)
Sodium: 141 mmol/L (ref 135–145)

## 2018-04-02 LAB — GLUCOSE, CAPILLARY
GLUCOSE-CAPILLARY: 98 mg/dL (ref 70–99)
GLUCOSE-CAPILLARY: 73 mg/dL (ref 70–99)
GLUCOSE-CAPILLARY: 92 mg/dL (ref 70–99)
Glucose-Capillary: 87 mg/dL (ref 70–99)

## 2018-04-02 LAB — HEMOGLOBIN A1C
HEMOGLOBIN A1C: 5.1 % (ref 4.8–5.6)
Mean Plasma Glucose: 99.67 mg/dL

## 2018-04-02 LAB — HIV ANTIBODY (ROUTINE TESTING W REFLEX): HIV Screen 4th Generation wRfx: NONREACTIVE

## 2018-04-02 LAB — STREP PNEUMONIAE URINARY ANTIGEN: Strep Pneumo Urinary Antigen: NEGATIVE

## 2018-04-02 MED ORDER — OSMOLITE 1.5 CAL PO LIQD
350.0000 mL | Freq: Four times a day (QID) | ORAL | Status: DC
Start: 2018-04-02 — End: 2018-04-04
  Administered 2018-04-02 – 2018-04-04 (×8): 350 mL
  Filled 2018-04-02: qty 1000
  Filled 2018-04-02 (×3): qty 474
  Filled 2018-04-02: qty 1000
  Filled 2018-04-02: qty 474
  Filled 2018-04-02: qty 1000
  Filled 2018-04-02 (×4): qty 474
  Filled 2018-04-02: qty 1000
  Filled 2018-04-02: qty 474
  Filled 2018-04-02: qty 1000

## 2018-04-02 NOTE — Progress Notes (Signed)
Initial Nutrition Assessment  DOCUMENTATION CODES:   Severe malnutrition in context of chronic illness  INTERVENTION:  Initiate bolus feedings of Osmolite 1.5 @  350 ml every 6 hr via  PEG    Tube feeding regimen provides 2100 kcal (100% of needs), 88 grams of protein, and 1067 ml of H2O.    IV fluids NS-@75  ml/hr-1800 ml daily at current rate  NUTRITION DIAGNOSIS:   Severe Malnutrition related to chronic illness(FTT, Dysphagia, PEG tube dependent) as evidenced by severe muscle depletion, severe fat depletion, NPO status.   GOAL:  Patient will meet greater than or equal to 90% of their needs(Unless contraindicated after palliative medicine meets with patient)   MONITOR:  TF tolerance, Labs, Skin, Weight trends   REASON FOR ASSESSMENT:   Consult Enteral/tube feeding initiation and management  ASSESSMENT:  Patient is a 57 year old from [facility]. He has a hx of stroke-s/p PEG placement due to severity of dysphagia. Also, FTT,  DM, HTN, CHF and malnutrition. Patient had a MBSS in June 25th 2019. ST continues to recommend NPO for patient.   Patient sleeping soundly on RD arrival. Blood on his nose and gown where he has been "picking at skin". Talked with his nurse today Nancy-they have cleaned him up several times already this morning. He had mitts to protect nasal area but patient has managed to remove the right one. Unable to awaken him enough to provide nutrition support regimen.   Last assessed by RD in June of 2018. Bolus tube feeding- Osmolite 1.5 @ 320 ml QID. His weight is down 4 lb since last year.Patient weight when last assessed by RD 59.3 kg (130.11 lb) in 02/15/17.  Would have expected pt to gain weight at that rate if provided consistently.  His needs were re-assessed and tube feeding goal adjusted. Will start today at 1200 as noted above. His PEG tube site initially documented as red and purulent drainage present.   He continues to be significantly under weight  and malnourished. Palliative medicine consult is pending and will be welcomed to establish goals of care.   Labs: BMP Latest Ref Rng & Units 04/02/2018 04/01/2018 02/14/2018  Glucose 70 - 99 mg/dL 161(W) 960(A) 540(J)  BUN 6 - 20 mg/dL 81(X) 91(Y) 78(G)  Creatinine 0.61 - 1.24 mg/dL 9.56(O) 1.30(Q) 6.57  Sodium 135 - 145 mmol/L 141 138 140  Potassium 3.5 - 5.1 mmol/L 4.3 4.3 4.0  Chloride 98 - 111 mmol/L 105 98 98(L)  CO2 22 - 32 mmol/L 29 28 32  Calcium 8.9 - 10.3 mg/dL 8.4(O) 9.5 9.6    Medications reviewed and include: Colace, Vitamin C, protonix, MVI, Ferrous sulfate, Insulin  IV fluids-NS @ 75 ml/hr- pt urine dark yellow.   NUTRITION - FOCUSED PHYSICAL EXAM:    Most Recent Value  Orbital Region  Moderate depletion  Upper Arm Region  Moderate depletion  Buccal Region  Moderate depletion  Temple Region  Moderate depletion  Clavicle Bone Region  Severe depletion  Clavicle and Acromion Bone Region  Severe depletion  Scapular Bone Region  Unable to assess  Dorsal Hand  Unable to assess  Patellar Region  Severe depletion  Anterior Thigh Region  Severe depletion  Posterior Calf Region  Severe depletion  Edema (RD Assessment)  None  Hair  Reviewed  Eyes  Unable to assess  Mouth  Unable to assess  Skin  Reviewed  Nails  Unable to assess      Diet Order:   Diet Order  Diet NPO time specified  Diet effective now          EDUCATION NEEDS:   Not appropriate for education at this time Skin:  Skin Assessment: Reviewed RN Assessment(blood from open area to right lower nare)  Last BM:  unknown  Height:   Ht Readings from Last 1 Encounters:  04/01/18 6\' 1"  (1.854 m)    Weight:   Wt Readings from Last 1 Encounters:  04/01/18 126 lb 1.7 oz (57.2 kg)    Ideal Body Weight:  84 kg  BMI:  Body mass index is 16.64 kg/m.  Estimated Nutritional Needs:   Kcal:  [id number] (35-38 kcal/kg/bw)  Protein:  80-91 gr (1.4-1.6 gr/kg/bw)  Fluid:  >1700 ml  daily   Royann ShiversLynn Cyniah Gossard MS,RD,CSG,LDN Office: [phone] Pager: [phone]

## 2018-04-02 NOTE — Progress Notes (Signed)
Tolerated second tube feeding today with no nausea.   SCD's ordered in place of Lovenox and placed Family at bedside at this time.  Dressing to peg tube changed

## 2018-04-02 NOTE — Progress Notes (Signed)
Nose wound free bleeding today.  He states he is not picking at it.  Tolerating tube feedings which began at lunch tho does give him hiccups.  Contacted Dr. Michel SanteeHernadez concerning freebleeding of nose

## 2018-04-02 NOTE — Consult Note (Signed)
Consultation Note Date: 04/02/2018   Patient Name: Ronald Ortiz  DOB: 14-Nov-1960  MRN: [id number]  Age / Sex: 57 y.o., male  PCP: Bernerd LimboAriza, Fernando Enrique, MD Referring Physician: Philip AspenHernandez Acosta, Minerva EndsEstela*  Reason for Consultation: Establishing goals of care  HPI/Patient Profile: 57 y.o. male  with past medical history of heart failure, diabetes, stroke with residual dysphasia requiring PEG tube as sole source of nutrition, hypertension admitted on 04/01/2018 with aspiration pneumonia.   Clinical Assessment and Goals of Care: [name] is lying quietly in bed.  He greets me making and keeping eye contact.  He is calm and cooperative.  Nursing staff is at bedside providing care.  There is no family at bedside at this time. [name] and I talk about his acute health problems, he has no insight into the severity of his illness, based on his response to my question of what would happen if we could no longer feed him via PEG tube.  He denies issues or concerns at this time, agreeing [facility]   Call to brother, Antony BlackbirdShane Leiphart.  Left voicemail message.  Healthcare power of attorney NEXT OF KIN -Mr. Hoobler states that his 2 brothers, Vincenza HewsShane and Francee PiccoloDerek help make choices when he cannot.   SUMMARY OF RECOMMENDATIONS   Continue to treat the treatable. [facility]. Continue CODE STATUS discussions with family.  Code Status/Advance Care Planning:  Full code  Symptom Management:   Per hospitalist, no additional needs at this time.  Palliative Prophylaxis:   Aspiration, Palliative Wound Care and Turn Reposition  Additional Recommendations (Limitations, Scope, Preferences):  Full Scope Treatment  Psycho-social/Spiritual:   Desire for further Chaplaincy support:no  Additional Recommendations: Caregiving  Support/Resources and Education on  Hospice  Prognosis:   < 6 months, be surprising based on functional status, history of stroke, resident of SNF.  Discharge Planning: Return to [facility]      Primary Diagnoses: Present on Admission: . Aspiration pneumonia (HCC) . Failure to thrive in adult . Hypertension . Hypercholesteremia . Stroke [facility](HCC)   I have reviewed the medical record, interviewed the patient and family, and examined the patient. The following aspects are pertinent.  Past Medical History:  Diagnosis Date  . CHF (congestive heart failure) (HCC)   . Diabetes mellitus without complication (HCC)   . Dysphagia   . Hypercholesteremia   . Hypertension   . Renal disorder   . Stroke [facility](HCC)    feb 14th, 2018; residual left sided weakness   Social History   Socioeconomic History  . Marital status: Divorced    Spouse name: Not on file  . Number of children: Not on file  . Years of education: Not on file  . Highest education level: Not on file  Occupational History  . Not on file  Social Needs  . Financial resource strain: Not on file  . Food insecurity:    Worry: Not on file    Inability:  Not on file  . Transportation needs:    Medical: Not on file    Non-medical: Not on file  Tobacco Use  . Smoking status: Never Smoker  . Smokeless tobacco: Never Used  Substance and Sexual Activity  . Alcohol use: No  . Drug use: No  . Sexual activity: Not on file  Lifestyle  . Physical activity:    Days per week: Not on file    Minutes per session: Not on file  . Stress: Not on file  Relationships  . Social connections:    Talks on phone: Not on file    Gets together: Not on file    Attends religious service: Not on file    Active member of club or organization: Not on file    Attends meetings of clubs or organizations: Not on file    Relationship status: Not on file  Other Topics Concern  . Not on file  Social History Narrative  . Not on file   Family History  Problem  Relation Age of Onset  . Hypertension Mother    Scheduled Meds: . amLODipine  10 mg Per Tube Daily  . atorvastatin  10 mg Per Tube QHS  . Chlorhexidine Gluconate Cloth  6 each Topical Q0600  . clopidogrel  75 mg Per Tube Daily  . docusate  5 mL Per Tube BID  . enoxaparin (LOVENOX) injection  40 mg Subcutaneous Q24H  . erythromycin  1 application Right Eye BID  . feeding supplement (OSMOLITE 1.5 CAL)  350 mL Per Tube QID  . ferrous sulfate  300 mg Oral TID WC  . fluticasone  1 spray Each Nare Daily  . insulin aspart  0-5 Units Subcutaneous QHS  . insulin aspart  0-9 Units Subcutaneous TID WC  . insulin aspart  3 Units Subcutaneous TID WC  . labetalol  200 mg Per Tube BID  . multivitamin  5 mL Oral Daily  . mupirocin ointment  1 application Nasal BID  . pantoprazole  40 mg Oral Daily  . sertraline  100 mg Per Tube Daily  . vitamin C  500 mg Per Tube BID   Continuous Infusions: . sodium chloride 75 mL/hr at 04/02/18 1337  . ampicillin-sulbactam (UNASYN) IV 3 g (04/02/18 1338)   PRN Meds:.acetaminophen, bisacodyl, hydrOXYzine, ondansetron (ZOFRAN) IV Medications Prior to Admission:  Prior to Admission medications   Medication Sig Start Date End Date Taking? Authorizing Provider  amLODipine (NORVASC) 10 MG tablet Place 10 mg into feeding tube daily.   Yes [provider]  atorvastatin (LIPITOR) 10 MG tablet Place 10 mg into feeding tube at bedtime.   Yes [provider]  clopidogrel (PLAVIX) 75 MG tablet Place 75 mg into feeding tube daily.   Yes [provider]  Docusate Sodium 50 MG/15ML LIQD Place 10 mLs into feeding tube 2 (two) times daily.   Yes [provider]  erythromycin ophthalmic ointment Place 1 application into the right eye 2 (two) times daily.   Yes [provider]  ferrous sulfate 220 (44 Fe) MG/5ML solution Take 308 mg by mouth 3 (three) times daily with meals. Give 7 mls with meals.   Yes [provider]    fluticasone (FLONASE) 50 MCG/ACT nasal spray Place 1 spray into both nostrils daily.   Yes [provider]  hydrOXYzine (ATARAX/VISTARIL) 25 MG tablet Place 25 mg into feeding tube every 8 (eight) hours as needed for itching.   Yes [provider]  labetalol (NORMODYNE)  200 MG tablet Place 200 mg into feeding tube 2 (two) times daily.    Yes [provider]  Multiple Vitamin (MULTIVITAMIN) LIQD Take 5 mLs by mouth daily.   Yes [provider]  omeprazole (PRILOSEC) 20 MG capsule Take 20 mg by mouth daily.   Yes [provider]  sertraline (ZOLOFT) 100 MG tablet Place 100 mg into feeding tube daily.   Yes [provider]  vitamin C (ASCORBIC ACID) 500 MG tablet Place 500 mg into feeding tube 2 (two) times daily.   Yes [provider]  white petrolatum ointment Apply 1 application topically daily. Apply to lips   Yes [provider]  zinc sulfate 220 (50 Zn) MG capsule Place 220 mg into feeding tube daily.   Yes [provider]   No Known Allergies Review of Systems  Unable to perform ROS: Dementia    Physical Exam  Constitutional: He appears ill.  Pulmonary/Chest: Effort normal. No respiratory distress.  Nursing note and vitals reviewed.   Vital Signs: BP 129/82 (BP Location: Right Arm)   Pulse (!) 117   Temp (!) 97.5 F (36.4 C) (Oral)   Resp 18   Ht 6\' 1"  (1.854 m)   Wt 57.2 kg (126 lb 1.7 oz)   SpO2 94%   BMI 16.64 kg/m  Pain Scale: 0-10   Pain Score: 0-No pain   SpO2: SpO2: 94 % O2 Device:SpO2: 94 % O2 Flow Rate: .O2 Flow Rate (L/min): 0 L/min  IO: Intake/output summary:   Intake/Output Summary (Last 24 hours) at 04/02/2018 1404 Last data filed at 04/02/2018 9528 Gross per 24 hour  Intake 1398.75 ml  Output 440 ml  Net 958.75 ml    LBM: Last BM Date: (pt unable to state date) Baseline Weight: Weight: 57.2 kg (126 lb) Most recent weight: Weight: 57.2 kg (126 lb 1.7 oz)      Palliative Assessment/Data:   Flowsheet Rows     Most Recent Value  Intake Tab  Referral Department  Hospitalist  Unit at Time of Referral  Med/Surg Unit  Palliative Care Primary Diagnosis  Pulmonary  Date Notified  04/01/18  Palliative Care Type  Return patient Palliative Care  Reason for referral  Clarify Goals of Care  Date of Admission  04/01/18  Date first seen by Palliative Care  04/02/18  # of days Palliative referral response time  1 Day(s)  # of days IP prior to Palliative referral  0  Clinical Assessment  Palliative Performance Scale Score  40%  Pain Max last 24 hours  Not able to report  Pain Min Last 24 hours  Not able to report  Dyspnea Max Last 24 Hours  Not able to report  Dyspnea Min Last 24 hours  Not able to report  Psychosocial & Spiritual Assessment  Palliative Care Outcomes  Patient/Family meeting held?  Yes      Time In: 1115 Time Out: 1150 Time Total: 35 minutes Greater than 50%  of this time was spent counseling and coordinating care related to the above assessment and plan.  Signed by: Katheran Awe, NP   Please contact Palliative Medicine Team phone at [phone] for questions and concerns.  For individual provider: See Loretha Stapler

## 2018-04-02 NOTE — Progress Notes (Signed)
Patient continuously picking his nose and causing bloody drainage to drain down his throat. Has vomited blood due to this. On call MD notified. Will notify oncoming nurse. Will continue to monitor.

## 2018-04-02 NOTE — Progress Notes (Signed)
PROGRESS NOTE    Ronald Ortiz  ZOX:[id number] DOB: Mar 11, 1961 DOA: 04/01/2018 PCP: Bernerd Limbo, MD     Brief Narrative:  57 year old man admitted from skilled nursing facility on 7/28 due to cough and fever.  This was preceded by nausea and vomiting, infiltrate on chest x-ray presumably due to aspiration pneumonia.  Admission requested.  He continues to have a nosebleed, I suspect he has been picking his nose.   Assessment & Plan:   Active Problems:   Failure to thrive in adult   Hypertension   Diabetes mellitus without complication (HCC)   Hypercholesteremia   Stroke (HCC)   Aspiration pneumonia (HCC)   ARF (acute renal failure) with tubular necrosis (HCC)   Palliative care by specialist   Aspiration pneumonia -Appears clinically improved, has defervesced, WBC count is improving. -Has severe dysphagia and is PEG tube dependent, cannot even have pleasure feeds by mouth. -Continue Unasyn for now.  Dysphagia -Following CVA, PEG tube dependent, dietitian has been consulted for initiation of PEG feedings.  Acute renal failure -Likely due to prerenal azotemia and sepsis. -Creatinine was 1.59 on admission down to 1.3 today, not  known to have renal insufficiency.  History of CVA -Noted  Hyperlipidemia -Continue statin  Diabetes -Well-controlled, continue current regimen  Epistaxis -Patient has been constantly picking his nose, under fingernails is dirty, suspect traumatic.  -We will continue to follow blood counts.     DVT prophylaxis: DC Lovenox and place on SCDs given epistaxis Code Status: Full code Family Communication: Patient only Disposition Plan: Request palliative care assistance for goals of care in this chronically debilitated patient with continued aspiration despite PEG tube.  Consultants:   Palliative care pending  Procedures:    none  Antimicrobials:  Anti-infectives (From admission, onward)   Start     Dose/Rate Route Frequency  Ordered Stop   04/01/18 1600  Ampicillin-Sulbactam (UNASYN) 3 g in sodium chloride 0.9 % 100 mL IVPB     3 g 200 mL/hr over 30 Minutes Intravenous Every 8 hours 04/01/18 1231     04/01/18 1000  piperacillin-tazobactam (ZOSYN) IVPB 3.375 g     3.375 g 100 mL/hr over 30 Minutes Intravenous  Once 04/01/18 0949 04/01/18 1108   04/01/18 1000  vancomycin (VANCOCIN) IVPB 1000 mg/200 mL premix     1,000 mg 200 mL/hr over 60 Minutes Intravenous  Once 04/01/18 0949 04/01/18 1208       Subjective: In bed, she is covered in blood from epistaxis  Objective: Vitals:   04/01/18 2017 04/01/18 2120 04/02/18 0713 04/02/18 1420  BP:  103/69 129/82 107/76  Pulse:  92 (!) 117 89  Resp:  20 18 18   Temp:  97.8 F (36.6 C) (!) 97.5 F (36.4 C) 98 F (36.7 C)  TempSrc:  Oral Oral Oral  SpO2: 94% 96% 94% 95%  Weight:      Height:        Intake/Output Summary (Last 24 hours) at 04/02/2018 1724 Last data filed at 04/02/2018 0711 Gross per 24 hour  Intake 955 ml  Output 440 ml  Net 515 ml   Filed Weights   04/01/18 0931 04/01/18 1257  Weight: 57.2 kg (126 lb) 57.2 kg (126 lb 1.7 oz)    Examination:  General exam: Alert, awake, oriented x 3, quite Respiratory system: Clear to auscultation. Respiratory effort normal. Cardiovascular system:RRR. No murmurs, rubs, gallops. Gastrointestinal system: Abdomen is nondistended, soft and nontender. No organomegaly or masses felt. Normal bowel sounds heard. Central  nervous system: Alert and oriented. No focal neurological deficits. Extremities: No C/C/E, +pedal pulses Skin: No rashes, lesions or ulcers Psychiatry: Judgement and insight appear normal. Mood & affect appropriate.     Data Reviewed: I have personally reviewed following labs and imaging studies  CBC: Recent Labs  Lab 04/01/18 0941 04/02/18 0552  WBC 21.2* 15.7*  NEUTROABS 20.0*  --   HGB 10.4* 8.2*  HCT 33.4* 27.0*  MCV 87.0 89.1  PLT 443* 393   Basic Metabolic Panel: Recent  Labs  Lab 04/01/18 0941 04/02/18 0552  NA 138 141  K 4.3 4.3  CL 98 105  CO2 28 29  GLUCOSE 137* 118*  BUN 39* 37*  CREATININE 1.59* 1.30*  CALCIUM 9.5 8.8*   GFR: Estimated Creatinine Clearance: 52.6 mL/min (A) (by C-G formula based on SCr of 1.3 mg/dL (H)). Liver Function Tests: Recent Labs  Lab 04/01/18 0941  AST 33  ALT 30  ALKPHOS 97  BILITOT 0.6  PROT 7.3  ALBUMIN 3.1*   No results for input(s): LIPASE, AMYLASE in the last 168 hours. No results for input(s): AMMONIA in the last 168 hours. Coagulation Profile: No results for input(s): INR, PROTIME in the last 168 hours. Cardiac Enzymes: Recent Labs  Lab 04/01/18 0944  TROPONINI <0.03   BNP (last 3 results) No results for input(s): PROBNP in the last 8760 hours. HbA1C: Recent Labs    04/01/18 0941  HGBA1C 5.1   CBG: Recent Labs  Lab 04/01/18 1623 04/01/18 2118 04/02/18 0741 04/02/18 1149 04/02/18 1637  GLUCAP 123* 100* 98 73 87   Lipid Profile: No results for input(s): CHOL, HDL, LDLCALC, TRIG, CHOLHDL, LDLDIRECT in the last 72 hours. Thyroid Function Tests: No results for input(s): TSH, T4TOTAL, FREET4, T3FREE, THYROIDAB in the last 72 hours. Anemia Panel: No results for input(s): VITAMINB12, FOLATE, FERRITIN, TIBC, IRON, RETICCTPCT in the last 72 hours. Urine analysis:    Component Value Date/Time   COLORURINE YELLOW 02/15/2017 1910   APPEARANCEUR CLEAR 02/15/2017 1910   LABSPEC 1.025 02/15/2017 1910   PHURINE 5.0 02/15/2017 1910   GLUCOSEU NEGATIVE 02/15/2017 1910   HGBUR NEGATIVE 02/15/2017 1910   BILIRUBINUR NEGATIVE 02/15/2017 1910   KETONESUR 20 (A) 02/15/2017 1910   PROTEINUR NEGATIVE 02/15/2017 1910   NITRITE NEGATIVE 02/15/2017 1910   LEUKOCYTESUR NEGATIVE 02/15/2017 1910   Sepsis Labs: @LABRCNTIP (procalcitonin:4,lacticidven:4)  ) Recent Results (from the past 240 hour(s))  Blood Culture (routine x 2)     Status: None (Preliminary result)   Collection Time: 04/01/18  9:43  AM  Result Value Ref Range Status   Specimen Description BLOOD RIGHT WRIST  Final   Special Requests   Final    BOTTLES DRAWN AEROBIC AND ANAEROBIC Blood Culture adequate volume   Culture   Final    NO GROWTH < 24 HOURS Performed at [facility], [address], [place], [place] [address]    Report Status PENDING  Incomplete  Blood Culture (routine x 2)     Status: None (Preliminary result)   Collection Time: 04/01/18  9:44 AM  Result Value Ref Range Status   Specimen Description RIGHT ANTECUBITAL  Final   Special Requests   Final    BOTTLES DRAWN AEROBIC AND ANAEROBIC Blood Culture adequate volume   Culture   Final    NO GROWTH < 24 HOURS Performed at [facility], [address], [place], [place] [address]    Report Status PENDING  Incomplete  MRSA PCR Screening     Status:  Abnormal   Collection Time: 04/01/18  1:10 PM  Result Value Ref Range Status   MRSA by PCR POSITIVE (A) NEGATIVE Final    Comment: RESULT CALLED TO, READ BACK BY AND VERIFIED WITH: HANDY,T @ 2117 ON 7.28.19 BY BOWMAN,L        The GeneXpert MRSA Assay (FDA approved for NASAL specimens only), is one component of a comprehensive MRSA colonization surveillance program. It is not intended to diagnose MRSA infection nor to guide or monitor treatment for MRSA infections. Performed at [facility], [address] [place], [place] [address]          Radiology Studies: [facility]  Result Date: 04/01/2018 CLINICAL DATA:  Acute shortness of breath.  Possible aspiration. EXAM: PORTABLE CHEST 1 VIEW COMPARISON:  02/14/2018 chest radiograph FINDINGS: LEFT LOWER lung airspace disease is compatible with pneumonia or aspiration. RIGHT lung is clear. The cardiomediastinal silhouette is unremarkable. No pleural effusion or pneumothorax. No acute bony abnormalities identified. IMPRESSION: LEFT LOWER lung airspace disease compatible with pneumonia or aspiration. Electronically Signed   By: Harmon PierJeffrey  Hu M.D.    On: 04/01/2018 10:09        Scheduled Meds: . amLODipine  10 mg Per Tube Daily  . atorvastatin  10 mg Per Tube QHS  . Chlorhexidine Gluconate Cloth  6 each Topical Q0600  . clopidogrel  75 mg Per Tube Daily  . docusate  5 mL Per Tube BID  . enoxaparin (LOVENOX) injection  40 mg Subcutaneous Q24H  . erythromycin  1 application Right Eye BID  . feeding supplement (OSMOLITE 1.5 CAL)  350 mL Per Tube QID  . ferrous sulfate  300 mg Oral TID WC  . fluticasone  1 spray Each Nare Daily  . insulin aspart  0-5 Units Subcutaneous QHS  . insulin aspart  0-9 Units Subcutaneous TID WC  . insulin aspart  3 Units Subcutaneous TID WC  . labetalol  200 mg Per Tube BID  . multivitamin  5 mL Oral Daily  . mupirocin ointment  1 application Nasal BID  . pantoprazole  40 mg Oral Daily  . sertraline  100 mg Per Tube Daily  . vitamin C  500 mg Per Tube BID   Continuous Infusions: . sodium chloride 75 mL/hr at 04/02/18 1337  . ampicillin-sulbactam (UNASYN) IV Stopped (04/02/18 1408)     LOS: 1 day    Time spent: 25 minutes.    Chaya JanEstela Hernandez Acosta, MD [facility] Pager [phone]  If 7PM-7AM, please contact night-coverage [email or web address] Password [email or web address] 04/02/2018, 5:24 PM

## 2018-04-03 DIAGNOSIS — Z515 Encounter for palliative care: Secondary | ICD-10-CM

## 2018-04-03 DIAGNOSIS — E43 Unspecified severe protein-calorie malnutrition: Secondary | ICD-10-CM

## 2018-04-03 DIAGNOSIS — Z7189 Other specified counseling: Secondary | ICD-10-CM

## 2018-04-03 LAB — BASIC METABOLIC PANEL
ANION GAP: 8 (ref 5–15)
BUN: 24 mg/dL — ABNORMAL HIGH (ref 6–20)
CHLORIDE: 109 mmol/L (ref 98–111)
CO2: 26 mmol/L (ref 22–32)
CREATININE: 1.04 mg/dL (ref 0.61–1.24)
Calcium: 8.4 mg/dL — ABNORMAL LOW (ref 8.9–10.3)
GFR calc non Af Amer: >60 mL/min (ref >60)
Glucose, Bld: 99 mg/dL (ref 70–99)
POTASSIUM: 3.2 mmol/L — AB (ref 3.5–5.1)
SODIUM: 143 mmol/L (ref 135–145)

## 2018-04-03 LAB — CBC
HCT: 22.3 % — ABNORMAL LOW (ref 39.0–52.0)
HCT: 34.5 % — ABNORMAL LOW (ref 39.0–52.0)
HEMOGLOBIN: 6.7 g/dL — AB (ref 13.0–17.0)
Hemoglobin: 11.3 g/dL — ABNORMAL LOW (ref 13.0–17.0)
MCH: 26.6 pg (ref 26.0–34.0)
MCH: 28.5 pg (ref 26.0–34.0)
MCHC: 30 g/dL (ref 30.0–36.0)
MCHC: 32.8 g/dL (ref 30.0–36.0)
MCV: 88.5 fL (ref 78.0–100.0)
MCV: 86.9 fL (ref 78.0–100.0)
PLATELETS: 364 10*3/uL (ref 150–400)
Platelets: 352 10*3/uL (ref 150–400)
RBC: 2.52 MIL/uL — AB (ref 4.22–5.81)
RBC: 3.97 MIL/uL — ABNORMAL LOW (ref 4.22–5.81)
RDW: 14.6 % (ref 11.5–15.5)
RDW: 14.6 % (ref 11.5–15.5)
WBC: 7.5 10*3/uL (ref 4.0–10.5)
WBC: 9.2 10*3/uL (ref 4.0–10.5)

## 2018-04-03 LAB — GLUCOSE, CAPILLARY
GLUCOSE-CAPILLARY: 74 mg/dL (ref 70–99)
Glucose-Capillary: 83 mg/dL (ref 70–99)
Glucose-Capillary: 101 mg/dL — ABNORMAL HIGH (ref 70–99)
Glucose-Capillary: 87 mg/dL (ref 70–99)

## 2018-04-03 LAB — PREPARE RBC (CROSSMATCH)

## 2018-04-03 LAB — LEGIONELLA PNEUMOPHILA SEROGP 1 UR AG: L. pneumophila Serogp 1 Ur Ag: NEGATIVE

## 2018-04-03 LAB — ABO/RH: ABO/RH(D): O POS

## 2018-04-03 MED ORDER — SODIUM CHLORIDE 0.9% IV SOLUTION
Freq: Once | INTRAVENOUS | Status: AC
  Administered 2018-04-03: 12:00:00 via INTRAVENOUS

## 2018-04-03 MED ORDER — ORAL CARE MOUTH RINSE
15.0000 mL | Freq: Two times a day (BID) | OROMUCOSAL | Status: DC
  Administered 2018-04-03 (×2): 15 mL via OROMUCOSAL

## 2018-04-03 NOTE — Progress Notes (Signed)
Contacted Dr. Ardyth HarpsHernandez concerning multiple loose stools.

## 2018-04-03 NOTE — Clinical Social Work Note (Signed)
Clinical Social Work Assessment  Patient Details  Name: Ronald Ortiz MRN: [id number] Date of Birth: Jan 06, 1961  Date of referral:  04/03/18               Reason for consult:  Other (Comment Required)(From JC)                Permission sought to share information with:    Permission granted to share information::     Name::        Agency::  [facility]  Relationship::     Contact Information:     Housing/Transportation Living arrangements for the past 2 months:  [facility] Source of Information:  Facility Patient Interpreter Needed:  None Criminal Activity/Legal Involvement Pertinent to Current Situation/Hospitalization:  No - Comment as needed Significant Relationships:  Siblings Lives with:  Facility Resident Do you feel safe going back to the place where you live?  Yes Need for family participation in patient care:  Yes (Comment)  Care giving concerns:  None identified. Facility resident.    Social Worker assessment / plan:  Patient has been at the facility for several months.  He can verbalize his wants and is in bed a lot. Patient receives continuous PEG tube feedings.  His brother is supportive. He is incontinent and max assist with ADLs.   Employment status:  Disabled (Comment on whether or not currently receiving Disability) Insurance information[place]  [place]:    Information / Referral to community resources:     Patient/Family's Response to care:  Patient is a long term resident at the facility.   Patient/Family's Understanding of and Emotional Response to Diagnosis, Current Treatment, and Prognosis:  Patient's family is aware of hospitalization and understand diagnosis, treatment and prognosis.   Emotional Assessment Appearance:  Appears stated age Attitude/Demeanor/Rapport:    Affect (typically observed):  Unable to Assess Orientation:  Oriented to Self, Oriented to Place, Oriented to  Time, Oriented to  Situation Alcohol / Substance use:  Not Applicable Psych involvement (Current and /or in the community):     Discharge Needs  Concerns to be addressed:  Other (Comment Required(return to facility ) Readmission within the last 30 days:  No Current discharge risk:  None Barriers to Discharge:  No Barriers Identified   Annice NeedySettle, Chaden Doom D, LCSW 04/03/2018, 4:30 PM

## 2018-04-03 NOTE — Progress Notes (Addendum)
PROGRESS NOTE    Ronald Ortiz  [id number] DOB: 1961/02/28 DOA: 04/01/2018 PCP: Bernerd LimboAriza, Fernando Enrique, MD     Brief Narrative:  57 year old man admitted from skilled nursing facility on 7/28 due to cough and fever.  This was preceded by nausea and vomiting, infiltrate on chest x-ray presumably due to aspiration pneumonia.  Admission requested.  Had nosebleed which seems to have stopped, also with diarrhea related to enteral feedings.  On 7/30 found to have a hemoglobin of 6.7, will receive 2 units of PRBCs.  Assessment & Plan:   Active Problems:   Failure to thrive in adult   Hypertension   Diabetes mellitus without complication (HCC)   Hypercholesteremia   Stroke (HCC)   Aspiration pneumonia (HCC)   ARF (acute renal failure) with tubular necrosis (HCC)   Palliative care by specialist   Aspiration pneumonia -Appears clinically improved, has defervesced, WBC count is improving. -Has severe dysphagia and is PEG tube dependent, cannot even have pleasure feeds by mouth. -Continue Unasyn for now.  Dysphagia -Following CVA, PEG tube dependent, dietitian has been consulted for initiation of PEG feedings.   Acute renal failure -Likely due to prerenal azotemia and sepsis. -Creatinine was 1.59 on admission down to 1.3 today, not  known to have renal insufficiency.  History of CVA -Noted  Hyperlipidemia -Continue statin  Diabetes -Well-controlled, continue current regimen  Acute blood loss anemia -Secondary to severe epistaxis.  Epistaxis seems to have stopped.  No sign-hemoglobin this a.m. is 6.7, received 2 units of PRBCs.  Epistaxis -Patient has been constantly picking his nose, under fingernails is dirty, suspect traumatic.  -No further epistaxis. -We will receive blood as above.  Diarrhea -Suspect related to enteral feedings.  Continue to monitor.  Severe Protein-Caloric Malnutrition -And adult failure to thrive. -Dietitian has recommended tube  feeds. -Continue palliative discussions.     DVT prophylaxis:SCDs Code Status: Full code Family Communication: Patient only Disposition Plan: Request palliative care assistance for goals of care in this chronically debilitated patient with continued aspiration despite PEG tube.  Consultants:   Palliative care pending  Procedures:    none  Antimicrobials:  Anti-infectives (From admission, onward)   Start     Dose/Rate Route Frequency Ordered Stop   04/01/18 1600  Ampicillin-Sulbactam (UNASYN) 3 g in sodium chloride 0.9 % 100 mL IVPB     3 g 200 mL/hr over 30 Minutes Intravenous Every 8 hours 04/01/18 1231     04/01/18 1000  piperacillin-tazobactam (ZOSYN) IVPB 3.375 g     3.375 g 100 mL/hr over 30 Minutes Intravenous  Once 04/01/18 0949 04/01/18 1108   04/01/18 1000  vancomycin (VANCOCIN) IVPB 1000 mg/200 mL premix     1,000 mg 200 mL/hr over 60 Minutes Intravenous  Once 04/01/18 0949 04/01/18 1208       Subjective: Lying in bed, states no complaints, states no loose stools or epistaxis since yesterday evening.  Objective: Vitals:   04/03/18 0633 04/03/18 1200 04/03/18 1234 04/03/18 1417  BP: 116/76 114/73 119/84 134/87  Pulse: 89 86 73 85  Resp: 15 16 16 17   Temp: 97.9 F (36.6 C) 97.8 F (36.6 C) 97.6 F (36.4 C) 98.1 F (36.7 C)  TempSrc: Oral Oral Oral Oral  SpO2: 100% 100% 100% 100%  Weight: 61.5 kg (135 lb 9.3 oz)     Height:        Intake/Output Summary (Last 24 hours) at 04/03/2018 1541 Last data filed at 04/03/2018 1430 Gross per 24 hour  Intake  2797 ml  Output 1550 ml  Net 1247 ml   Filed Weights   04/01/18 0931 04/01/18 1257 04/03/18 0633  Weight: 57.2 kg (126 lb) 57.2 kg (126 lb 1.7 oz) 61.5 kg (135 lb 9.3 oz)    Examination:  General exam: Alert, awake, oriented x 3, cachectic, unkempt Respiratory system: Clear to auscultation. Respiratory effort normal. Cardiovascular system:RRR. No murmurs, rubs, gallops. Gastrointestinal system:  Abdomen is nondistended, soft and nontender. No organomegaly or masses felt. Normal bowel sounds heard. Central nervous system: Alert and oriented. No focal neurological deficits. Extremities: No C/C/E, +pedal pulses Skin: No rashes, lesions or ulcers Psychiatry: . Mood & affect appropriate.      Data Reviewed: I have personally reviewed following labs and imaging studies  CBC: Recent Labs  Lab 04/01/18 0941 04/02/18 0552 04/03/18 0538  WBC 21.2* 15.7* 7.5  NEUTROABS 20.0*  --   --   HGB 10.4* 8.2* 6.7*  HCT 33.4* 27.0* 22.3*  MCV 87.0 89.1 88.5  PLT 443* 393 352   Basic Metabolic Panel: Recent Labs  Lab 04/01/18 0941 04/02/18 0552 04/03/18 0538  NA 138 141 143  K 4.3 4.3 3.2*  CL 98 105 109  CO2 28 29 26   GLUCOSE 137* 118* 99  BUN 39* 37* 24*  CREATININE 1.59* 1.30* 1.04  CALCIUM 9.5 8.8* 8.4*   GFR: Estimated Creatinine Clearance: 70.6 mL/min (by C-G formula based on SCr of 1.04 mg/dL). Liver Function Tests: Recent Labs  Lab 04/01/18 0941  AST 33  ALT 30  ALKPHOS 97  BILITOT 0.6  PROT 7.3  ALBUMIN 3.1*   No results for input(s): LIPASE, AMYLASE in the last 168 hours. No results for input(s): AMMONIA in the last 168 hours. Coagulation Profile: No results for input(s): INR, PROTIME in the last 168 hours. Cardiac Enzymes: Recent Labs  Lab 04/01/18 0944  TROPONINI <0.03   BNP (last 3 results) No results for input(s): PROBNP in the last 8760 hours. HbA1C: Recent Labs    04/01/18 0941  HGBA1C 5.1   CBG: Recent Labs  Lab 04/02/18 1149 04/02/18 1637 04/02/18 2122 04/03/18 0721 04/03/18 1122  GLUCAP 73 87 92 83 101*   Lipid Profile: No results for input(s): CHOL, HDL, LDLCALC, TRIG, CHOLHDL, LDLDIRECT in the last 72 hours. Thyroid Function Tests: No results for input(s): TSH, T4TOTAL, FREET4, T3FREE, THYROIDAB in the last 72 hours. Anemia Panel: No results for input(s): VITAMINB12, FOLATE, FERRITIN, TIBC, IRON, RETICCTPCT in the last 72  hours. Urine analysis:    Component Value Date/Time   COLORURINE YELLOW 02/15/2017 1910   APPEARANCEUR CLEAR 02/15/2017 1910   LABSPEC 1.025 02/15/2017 1910   PHURINE 5.0 02/15/2017 1910   GLUCOSEU NEGATIVE 02/15/2017 1910   HGBUR NEGATIVE 02/15/2017 1910   BILIRUBINUR NEGATIVE 02/15/2017 1910   KETONESUR 20 (A) 02/15/2017 1910   PROTEINUR NEGATIVE 02/15/2017 1910   NITRITE NEGATIVE 02/15/2017 1910   LEUKOCYTESUR NEGATIVE 02/15/2017 1910   Sepsis Labs: @LABRCNTIP (procalcitonin:4,lacticidven:4)  ) Recent Results (from the past 240 hour(s))  Blood Culture (routine x 2)     Status: None (Preliminary result)   Collection Time: 04/01/18  9:43 AM  Result Value Ref Range Status   Specimen Description BLOOD RIGHT WRIST  Final   Special Requests   Final    BOTTLES DRAWN AEROBIC AND ANAEROBIC Blood Culture adequate volume   Culture   Final    NO GROWTH 2 DAYS Performed at [facility], [address], [place], [place] [address]    Report Status  PENDING  Incomplete  Blood Culture (routine x 2)     Status: None (Preliminary result)   Collection Time: 04/01/18  9:44 AM  Result Value Ref Range Status   Specimen Description RIGHT ANTECUBITAL  Final   Special Requests   Final    BOTTLES DRAWN AEROBIC AND ANAEROBIC Blood Culture adequate volume   Culture   Final    NO GROWTH 2 DAYS Performed at [facility], [address], [place], [place] [address]    Report Status PENDING  Incomplete  MRSA PCR Screening     Status: Abnormal   Collection Time: 04/01/18  1:10 PM  Result Value Ref Range Status   MRSA by PCR POSITIVE (A) NEGATIVE Final    Comment: RESULT CALLED TO, READ BACK BY AND VERIFIED WITH: HANDY,T @ 2117 ON 7.28.19 BY BOWMAN,L        The GeneXpert MRSA Assay (FDA approved for NASAL specimens only), is one component of a comprehensive MRSA colonization surveillance program. It is not intended to diagnose MRSA infection nor to guide or monitor treatment for MRSA  infections. Performed at [facility], [address] [place], [place] [address]          Radiology Studies: No results found.      Scheduled Meds: . amLODipine  10 mg Per Tube Daily  . atorvastatin  10 mg Per Tube QHS  . Chlorhexidine Gluconate Cloth  6 each Topical Q0600  . clopidogrel  75 mg Per Tube Daily  . docusate  5 mL Per Tube BID  . erythromycin  1 application Right Eye BID  . feeding supplement (OSMOLITE 1.5 CAL)  350 mL Per Tube QID  . ferrous sulfate  300 mg Oral TID WC  . fluticasone  1 spray Each Nare Daily  . insulin aspart  0-5 Units Subcutaneous QHS  . insulin aspart  0-9 Units Subcutaneous TID WC  . insulin aspart  3 Units Subcutaneous TID WC  . labetalol  200 mg Per Tube BID  . mouth rinse  15 mL Mouth Rinse BID  . multivitamin  5 mL Oral Daily  . mupirocin ointment  1 application Nasal BID  . pantoprazole  40 mg Oral Daily  . sertraline  100 mg Per Tube Daily  . vitamin C  500 mg Per Tube BID   Continuous Infusions: . sodium chloride 75 mL/hr at 04/02/18 1337  . ampicillin-sulbactam (UNASYN) IV 3 g (04/03/18 1442)     LOS: 2 days    Time spent: 25 minutes.    Chaya Jan, MD [facility] Pager [phone]  If 7PM-7AM, please contact night-coverage [email or web address] Password [email or web address] 04/03/2018, 3:41 PM

## 2018-04-03 NOTE — NC FL2 (Signed)
[place] MEDICAID FL2 LEVEL OF CARE SCREENING TOOL     IDENTIFICATION  Patient Name: Ronald Ortiz Birthdate: 03-Apr-1961 Sex: male Admission Date (Current Location): 04/01/2018  [facility] and [place] Number:  [employer] and Address:  [facility],  [address], [place] [address]      Provider Number: [id number]  Attending Physician Name and Address:  Philip Aspen, Minerva Ends*  Relative Name and Phone Number:       Current Level of Care: Hospital Recommended Level of Care: Skilled Nursing Facility Prior Approval Number:    Date Approved/Denied:   PASRR Number:    Discharge Plan: SNF    Current Diagnoses: Patient Active Problem List   Diagnosis Date Noted  . Encounter for hospice care discussion   . DNR (do not resuscitate) discussion   . Palliative care by specialist   . Aspiration pneumonia (HCC) 04/01/2018  . ARF (acute renal failure) with tubular necrosis (HCC) 04/01/2018  . Goals of care, counseling/discussion   . Palliative care encounter   . Failure to thrive in adult 02/15/2017  . Hypertension 02/15/2017  . Diabetes mellitus without complication (HCC) 02/15/2017  . Hypercholesteremia 02/15/2017  . Stroke (HCC) 02/15/2017  . Volume depletion 02/15/2017    Orientation RESPIRATION BLADDER Height & Weight     Self, Time, Situation, Place  Normal Incontinent Weight: 135 lb 9.3 oz (61.5 kg) Height:  6\' 1"  (185.4 cm)  BEHAVIORAL SYMPTOMS/MOOD NEUROLOGICAL BOWEL NUTRITION STATUS      Incontinent Feeding tube  AMBULATORY STATUS COMMUNICATION OF NEEDS Skin   Total Care Verbally Normal                       Personal Care Assistance Level of Assistance  Bathing, Feeding, Dressing Bathing Assistance: Maximum assistance Feeding assistance: Maximum assistance(PEG Tube) Dressing Assistance: Maximum assistance     Functional Limitations Info  Sight, Hearing, Speech Sight Info: Adequate Hearing Info: Adequate Speech Info:  Adequate    SPECIAL CARE FACTORS FREQUENCY                       Contractures Contractures Info: Not present    Additional Factors Info  Code Status, Allergies, Psychotropic, Isolation Precautions Code Status Info: Full code Allergies Info: NKA Psychotropic Info: Zoloft   Isolation Precautions Info: 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 0.9 %  sodium chloride infusion   Intravenous Continuous Philip Aspen, Limmie Patricia, MD 75 mL/hr at 04/02/18 1337    . acetaminophen (TYLENOL) suppository 650 mg  650 mg Rectal Q6H PRN Philip Aspen, Limmie Patricia, MD      . amLODipine (NORVASC) tablet 10 mg  10 mg Per Tube Daily Philip Aspen, Limmie Patricia, MD   10 mg at 04/03/18 8119  . Ampicillin-Sulbactam (UNASYN) 3 g in sodium chloride 0.9 % 100 mL IVPB  3 g Intravenous Q8H Philip Aspen, Limmie Patricia, MD   Stopped at 04/03/18 1512  . atorvastatin (LIPITOR) tablet 10 mg  10 mg Per Tube QHS Philip Aspen, Limmie Patricia, MD   10 mg at 04/02/18 2111  . bisacodyl (DULCOLAX) suppository 10 mg  10 mg Rectal Daily PRN Philip Aspen, Limmie Patricia, MD      . Chlorhexidine Gluconate Cloth 2 % PADS 6 each  6 each Topical [phone]  Philip AspenHernandez Acosta, Limmie PatriciaEstela Y, MD   6 each at 04/03/18 [phone]  . clopidogrel (PLAVIX) tablet 75 mg  75 mg Per Tube Daily Philip AspenHernandez Acosta, Limmie PatriciaEstela Y, MD   75 mg at 04/03/18 [phone]  . docusate (COLACE) 50 MG/5ML liquid 50 mg  5 mL Per Tube BID Philip AspenHernandez Acosta, Limmie PatriciaEstela Y, MD   50 mg at 04/02/18 0835  . erythromycin ophthalmic ointment 1 application  1 application Right Eye BID Philip AspenHernandez Acosta, Limmie PatriciaEstela Y, MD   1 application at 04/03/18 [phone]  . feeding supplement (OSMOLITE 1.5 CAL) liquid 350 mL  350 mL Per Tube QID Philip AspenHernandez Acosta, Limmie PatriciaEstela Y, MD   350 mL at 04/03/18 1230  . ferrous sulfate 300 (60 Fe) MG/5ML syrup  300 mg  300 mg Oral TID WC Philip AspenHernandez Acosta, Limmie PatriciaEstela Y, MD   300 mg at 04/03/18 1231  . fluticasone (FLONASE) 50 MCG/ACT nasal spray 1 spray  1 spray Each Nare Daily Philip AspenHernandez Acosta, Limmie PatriciaEstela Y, MD   1 spray at 04/03/18 [phone]  . hydrOXYzine (ATARAX/VISTARIL) tablet 25 mg  25 mg Per Tube Q8H PRN Philip AspenHernandez Acosta, Limmie PatriciaEstela Y, MD      . insulin aspart (novoLOG) injection 0-5 Units  0-5 Units Subcutaneous QHS Philip AspenHernandez Acosta, Limmie PatriciaEstela Y, MD      . insulin aspart (novoLOG) injection 0-9 Units  0-9 Units Subcutaneous TID WC Philip AspenHernandez Acosta, Limmie PatriciaEstela Y, MD   1 Units at 04/01/18 1751  . insulin aspart (novoLOG) injection 3 Units  3 Units Subcutaneous TID WC Philip AspenHernandez Acosta, Limmie PatriciaEstela Y, MD   3 Units at 04/03/18 1231  . labetalol (NORMODYNE) tablet 200 mg  200 mg Per Tube BID Philip AspenHernandez Acosta, Limmie PatriciaEstela Y, MD   200 mg at 04/03/18 0908  . MEDLINE mouth rinse  15 mL Mouth Rinse BID Philip AspenHernandez Acosta, Limmie PatriciaEstela Y, MD   15 mL at 04/03/18 [id number]  . multivitamin liquid 5 mL  5 mL Oral Daily Philip AspenHernandez Acosta, Limmie PatriciaEstela Y, MD   5 mL at 04/03/18 0906  . mupirocin ointment (BACTROBAN) 2 % 1 application  1 application Nasal BID Philip AspenHernandez Acosta, Limmie PatriciaEstela Y, MD   1 application at 04/03/18 [phone]  . ondansetron (ZOFRAN) injection 4 mg  4 mg Intravenous Q6H PRN Philip AspenHernandez Acosta, Limmie PatriciaEstela Y, MD   4 mg at 04/01/18 1837  . pantoprazole (PROTONIX) EC tablet 40 mg  40 mg Oral Daily Philip AspenHernandez Acosta, Limmie PatriciaEstela Y, MD   40 mg at 04/03/18 [id number]  . sertraline (ZOLOFT) tablet 100 mg  100 mg Per Tube Daily Philip AspenHernandez Acosta, Limmie PatriciaEstela Y, MD   100 mg at 04/03/18 [phone]  . vitamin C (ASCORBIC ACID) tablet 500 mg  500 mg Per Tube BID Philip AspenHernandez Acosta, Limmie PatriciaEstela Y, MD   500 mg at 04/03/18 [id number]     Discharge Medications: Please see discharge summary for a list of discharge medications.  Relevant Imaging Results:  Relevant Lab Results:   Additional Information SSn:  [id number]     Annice NeedySettle, Christabelle Hanzlik D, LCSW

## 2018-04-03 NOTE — Progress Notes (Signed)
Blood transfusing with no adverse reaction.  Also,  Continues tube feeding bolus and reports no loose stools today

## 2018-04-03 NOTE — Progress Notes (Signed)
Has had only one loose stool so far today

## 2018-04-03 NOTE — Progress Notes (Signed)
CRITICAL VALUE ALERT  Critical Value:  Hgb 6.7  Date & Time Notied:  04/03/18 0700  Provider Notified: Dr. Ardyth HarpsHernandez 04/03/18 0715    Orders Received/Actions taken: No response yet. Harriett SineNancy, RN aware.

## 2018-04-03 NOTE — Progress Notes (Addendum)
Palliative: [name] is lying quietly in bed.  Nursing staff is at bedside providing care.  He is calm and cooperative, oriented to person and place, but continues to have no insight to the severity of his illness, or outcomes from treatment/procedures.  There is no family at bedside at this time. [name]'s hemoglobin reduced from 8.2 on 7/29 to 6.7 today, he is unable to stop picking at a wound at his nose.  Call to brother, Ronald Ortiz.  Ronald Ortiz and his wife Ronald Ortiz are on the phone.  We talked about aspiration pneumonia, failure to thrive, weight of 135 pounds.  We talked about decreased hemoglobin today.  Ronald Ortiz states that he is aware, and he signed consent for blood.  I share that this is likely related to [name]'s inability to stop picking at his nose.  Ronald Ortiz states that while [name] was in their home, he also continuously picked that his nose causing it to bleed.    We talked about capacity.  I share that although [name] is alert and oriented to person and place, he does not understand the consequences of some of his choices.  I give an example related to tube feeding, and what would happen if [name] did not eat.  We talked about [facility] with the benefits of hospice, continuing to treat the treatable, but dignity for [name].  We talked about CODE STATUS, life support.  I ask if Ronald Ortiz and Ronald Ortiz had ever considered life support for [name].  They said they have not.  I shared that the medical team does not recommend extraordinary measures, life support for [name].  I share that his dignity has been reduced by his stroke, living in a nursing home.   The medical team recommends comfort and dignity, when his heart naturally stops we let him go.  There is no response to the statement.  We also talked about how to make choices for loved ones including what would the Ronald Ortiz of 10 years ago say would be the right way to care for Ronald Ortiz now.  Her that PMT would like to hold a family  meeting with Ronald Ortiz and Ronald Ortiz.  Family states that Ronald Ortiz is traveling with his job.  I share that we can have a phone conference if he can be available. No further questions at this time.  No decisions related to disposition.    PMT will continue to follow. 40 minutes Ronald Carmelasha Nkosi Cortright, NP Palliative Medicine Team Team Phone # [phone]  Greater than 50% of this time was spent counseling and coordinating care related to the above assessment and plan

## 2018-04-04 DIAGNOSIS — J690 Pneumonitis due to inhalation of food and vomit: Secondary | ICD-10-CM

## 2018-04-04 DIAGNOSIS — E119 Type 2 diabetes mellitus without complications: Secondary | ICD-10-CM

## 2018-04-04 DIAGNOSIS — R627 Adult failure to thrive: Secondary | ICD-10-CM

## 2018-04-04 DIAGNOSIS — N170 Acute kidney failure with tubular necrosis: Secondary | ICD-10-CM

## 2018-04-04 LAB — TYPE AND SCREEN
ABO/RH(D): O POS
Antibody Screen: NEGATIVE
UNIT DIVISION: 0
UNIT DIVISION: 0

## 2018-04-04 LAB — BASIC METABOLIC PANEL
Anion gap: 9 (ref 5–15)
BUN: 16 mg/dL (ref 6–20)
CALCIUM: 8.6 mg/dL — AB (ref 8.9–10.3)
CO2: 25 mmol/L (ref 22–32)
CREATININE: 0.87 mg/dL (ref 0.61–1.24)
Chloride: 106 mmol/L (ref 98–111)
GLUCOSE: 95 mg/dL (ref 70–99)
Potassium: 3.3 mmol/L — ABNORMAL LOW (ref 3.5–5.1)
Sodium: 140 mmol/L (ref 135–145)

## 2018-04-04 LAB — CBC
HCT: 32.5 % — ABNORMAL LOW (ref 39.0–52.0)
Hemoglobin: 10.6 g/dL — ABNORMAL LOW (ref 13.0–17.0)
MCH: 28.1 pg (ref 26.0–34.0)
MCHC: 32.6 g/dL (ref 30.0–36.0)
MCV: 86.2 fL (ref 78.0–100.0)
PLATELETS: 332 10*3/uL (ref 150–400)
RBC: 3.77 MIL/uL — AB (ref 4.22–5.81)
RDW: 14.9 % (ref 11.5–15.5)
WBC: 8.7 10*3/uL (ref 4.0–10.5)

## 2018-04-04 LAB — BPAM RBC
BLOOD PRODUCT EXPIRATION DATE: 201908182359
BLOOD PRODUCT EXPIRATION DATE: 201908262359
ISSUE DATE / TIME: 201907301204
ISSUE DATE / TIME: 201907301729
UNIT TYPE AND RH: 5100
Unit Type and Rh: 5100

## 2018-04-04 LAB — GLUCOSE, CAPILLARY
GLUCOSE-CAPILLARY: 73 mg/dL (ref 70–99)
GLUCOSE-CAPILLARY: 154 mg/dL — AB (ref 70–99)

## 2018-04-04 MED ORDER — DOUBLE ANTIBIOTIC 500-10000 UNIT/GM EX OINT: 1.0000 "application " | TOPICAL_OINTMENT | Freq: Two times a day (BID) | CUTANEOUS | 0 refills | Status: AC

## 2018-04-04 MED ORDER — AMOXICILLIN-POT CLAVULANATE 600-42.9 MG/5ML PO SUSR: 600.0000 mg | Freq: Two times a day (BID) | ORAL | 0 refills | Status: AC

## 2018-04-04 MED ORDER — POTASSIUM CHLORIDE 20 MEQ/15ML (10%) PO SOLN
40.0000 meq | Freq: Once | ORAL | Status: AC
  Administered 2018-04-04: 40 meq
  Filled 2018-04-04: qty 30

## 2018-04-04 NOTE — Progress Notes (Signed)
Palliative:  [name] is resting quietly in bed. He appears chronically ill and frail.  No family at bedside today.  Call [facility] to speak with admissions co ordinator about disposition plan and recommendation to continue hospice conversations/referral.  Conversation with hospitalist rt disposition plan.  Conversation with Child psychotherapistsocial worker rt disposition plan.  25 minutes  Ronald Carmelasha Mazey Mantell, NP Palliative Medicine Team Team Phone # [phone]  Greater than 50% of this time was spent counseling and coordinating care related to the above assessment and plan

## 2018-04-04 NOTE — Progress Notes (Signed)
Patient given Osmolite tube feeding per PEG tube bolus while sitting at 45 degree angle. Patient denied nausea. Patient spit out small amounts of liquid looking like tube feeding several times during slow bolus. Since bolus has finished, patient has tolerated well. Patient still repeatedly asking for ice chips frequently. Patient educated on NPO status but insists on having ice chips. Will continue to monitor.

## 2018-04-04 NOTE — Discharge Summary (Signed)
Physician Discharge Summary  Ronald Ortiz [id number] DOB: May 15, 1961 DOA: 04/01/2018  PCP: Bernerd LimboAriza, Ronald Enrique, MD  Admit date: 04/01/2018  Discharge date: 04/04/2018  Admitted From:[facility]  Disposition:  SNF  Recommendations for Outpatient Follow-up:  1. Follow up with PCP in 1-2 weeks 2. Continue Augmentin for 4 more days along with Bacitracin at PEG site  Home Health:[name]  Equipment/Devices:[name]  Discharge Condition:Stable  CODE STATUS: Full  Diet recommendation: PEG feeds; no pleasure feeds  Brief/Interim Summary:  57 year old man admitted from skilled nursing facility on 7/28 due to cough and fever.  This was preceded by nausea and vomiting, infiltrate on chest x-ray presumably due to aspiration pneumonia.  He has been treated with 4 days of IV Unasyn and has been converted over to liquid Augmentin to complete the course of treatment.  He was noted to have some mild diarrhea with enteral tube feedings which have now resolved.  Hold stool softeners until stool is more formed.  PEG tube site also noted to have some mild cellulitis for which bacitracin has been ordered.  He was also noted to be anemic with hemoglobin 6.7 after an episode of severe epistaxis which is now controlled, and has received 2 units of PRBCs with improvement in anemia noted.   Discharge Diagnoses:  Active Problems:   Failure to thrive in adult   Hypertension   Diabetes mellitus without complication (HCC)   Hypercholesteremia   Stroke (HCC)   Aspiration pneumonia (HCC)   ARF (acute renal failure) with tubular necrosis (HCC)   Palliative care by specialist   Encounter for hospice care discussion   DNR (do not resuscitate) discussion   Protein-calorie malnutrition, severe  1. Aspiration pneumonia-improved.  This is related to patient's severe dysphagia and pleasure feeds are not allowed.  Hospice to further evaluate to see if these could be allowed and patient can be more comfort  measures in the future.  Finish course of Augmentin as prescribed for 4 more days. 2. Acute kidney injury secondary to sepsis.  Resolved. 3. Acute blood loss anemia secondary to severe epistaxis with finger trauma.  Stabilized and resolved status post 2 unit PRBCs. 4. Dysphasia secondary to history of CVA.  PEG tube dependent; continue feeds as previously. 5. Dyslipidemia.  Continue statin. 6. Diabetes.  Well controlled.  Continue prior regimen. 7. Mild diarrhea secondary to enteral feeds.  Continue to monitor and hold Colace. 8. Severe protein calorie malnutrition.  Continue tube feeds with palliative discussions.  Discharge Instructions  Discharge Instructions    Diet - low sodium heart healthy   Complete by:  As directed    Increase activity slowly   Complete by:  As directed      Allergies as of 04/04/2018   No Known Allergies     Medication List    STOP taking these medications   Docusate Sodium 50 MG/15ML Liqd     TAKE these medications   amLODipine 10 MG tablet Commonly known as:  NORVASC Place 10 mg into feeding tube daily.   amoxicillin-clavulanate 600-42.9 MG/5ML suspension Commonly known as:  AUGMENTIN ES-600 Place 5 mLs (600 mg total) into feeding tube 2 (two) times daily for 4 days.   atorvastatin 10 MG tablet Commonly known as:  LIPITOR Place 10 mg into feeding tube at bedtime.   clopidogrel 75 MG tablet Commonly known as:  PLAVIX Place 75 mg into feeding tube daily.   erythromycin ophthalmic ointment Place 1 application into the right eye 2 (two) times  daily.   ferrous sulfate 220 (44 Fe) MG/5ML solution Take 308 mg by mouth 3 (three) times daily with meals. Give 7 mls with meals.   fluticasone 50 MCG/ACT nasal spray Commonly known as:  FLONASE Place 1 spray into both nostrils daily.   hydrOXYzine 25 MG tablet Commonly known as:  ATARAX/VISTARIL Place 25 mg into feeding tube every 8 (eight) hours as needed for itching.   labetalol 200 MG  tablet Commonly known as:  NORMODYNE Place 200 mg into feeding tube 2 (two) times daily.   multivitamin Liqd Take 5 mLs by mouth daily.   omeprazole 20 MG capsule Commonly known as:  PRILOSEC Take 20 mg by mouth daily.   polymixin-bacitracin 500-10000 UNIT/GM Oint ointment Apply 1 application topically 2 (two) times daily.   sertraline 100 MG tablet Commonly known as:  ZOLOFT Place 100 mg into feeding tube daily.   vitamin C 500 MG tablet Commonly known as:  ASCORBIC ACID Place 500 mg into feeding tube 2 (two) times daily.   white petrolatum ointment Apply 1 application topically daily. Apply to lips   zinc sulfate 220 (50 Zn) MG capsule Place 220 mg into feeding tube daily.       Contact information for follow-up providers    Bernerd Limbo, MD Follow up in 1 week(s).   Specialty:  Internal Medicine Contact information: [address] [place] [address] [phone]            Contact information for after-discharge care    Destination    [facility] .   Service:  Skilled Nursing Contact information: [address] [place] [address] [phone]                 No Known Allergies  Consultations:  Palliative Care   Procedures/Studies: Dg Chest Port 1 View  Result Date: 04/01/2018 CLINICAL DATA:  Acute shortness of breath.  Possible aspiration. EXAM: PORTABLE CHEST 1 VIEW COMPARISON:  02/14/2018 chest radiograph FINDINGS: LEFT LOWER lung airspace disease is compatible with pneumonia or aspiration. RIGHT lung is clear. The cardiomediastinal silhouette is unremarkable. No pleural effusion or pneumothorax. No acute bony abnormalities identified. IMPRESSION: LEFT LOWER lung airspace disease compatible with pneumonia or aspiration. Electronically Signed   By: Harmon Pier M.D.   On: 04/01/2018 10:09     Discharge Exam: Vitals:   04/03/18 2113 04/04/18 0557  BP: (!) 139/96 (!) 146/97  Pulse: 81 76  Resp: 16  16  Temp: 98.3 F (36.8 C) 97.9 F (36.6 C)  SpO2: 100% 100%   Vitals:   04/03/18 2020 04/03/18 2113 04/04/18 0557 04/04/18 0628  BP:  (!) 139/96 (!) 146/97   Pulse:  81 76   Resp:  16 16   Temp: 98 F (36.7 C) 98.3 F (36.8 C) 97.9 F (36.6 C)   TempSrc: Oral Oral Oral   SpO2:  100% 100%   Weight:    57.8 kg (127 lb 6.8 oz)  Height:        General: Pt is alert, awake, not in acute distress Cardiovascular: RRR, S1/S2 +, no rubs, no gallops Respiratory: CTA bilaterally, no wheezing, no rhonchi Abdominal: Soft, NT, ND, bowel sounds +; PEG site with mild erythema and drainage. Extremities: no edema, no cyanosis Condom cath: Clear, yellow, UO noted    The results of significant diagnostics from this hospitalization (including imaging, microbiology, ancillary and laboratory) are listed below for reference.     Microbiology: Recent Results (from  the past 240 hour(s))  Blood Culture (routine x 2)     Status: None (Preliminary result)   Collection Time: 04/01/18  9:43 AM  Result Value Ref Range Status   Specimen Description BLOOD RIGHT WRIST  Final   Special Requests   Final    BOTTLES DRAWN AEROBIC AND ANAEROBIC Blood Culture adequate volume   Culture   Final    NO GROWTH 3 DAYS Performed at [facility], [address] [place], [place] [address]    Report Status PENDING  Incomplete  Blood Culture (routine x 2)     Status: None (Preliminary result)   Collection Time: 04/01/18  9:44 AM  Result Value Ref Range Status   Specimen Description RIGHT ANTECUBITAL  Final   Special Requests   Final    BOTTLES DRAWN AEROBIC AND ANAEROBIC Blood Culture adequate volume   Culture   Final    NO GROWTH 3 DAYS Performed at [facility], [address] [place], [place] [address]    Report Status PENDING  Incomplete  MRSA PCR Screening     Status: Abnormal   Collection Time: 04/01/18  1:10 PM  Result Value Ref Range Status   MRSA by PCR POSITIVE (A) NEGATIVE Final    Comment:  RESULT CALLED TO, READ BACK BY AND VERIFIED WITH: HANDY,T @ 2117 ON 7.28.19 BY BOWMAN,L        The GeneXpert MRSA Assay (FDA approved for NASAL specimens only), is one component of a comprehensive MRSA colonization surveillance program. It is not intended to diagnose MRSA infection nor to guide or monitor treatment for MRSA infections. Performed at [facility], [address] [place], [place] [address]      Labs: BNP (last 3 results) No results for input(s): BNP in the last 8760 hours. Basic Metabolic Panel: Recent Labs  Lab 04/01/18 0941 04/02/18 0552 04/03/18 0538 04/04/18 0548  NA 138 141 143 140  K 4.3 4.3 3.2* 3.3*  CL 98 105 109 106  CO2 28 29 26 25   GLUCOSE 137* 118* 99 95  BUN 39* 37* 24* 16  CREATININE 1.59* 1.30* 1.04 0.87  CALCIUM 9.5 8.8* 8.4* 8.6*   Liver Function Tests: Recent Labs  Lab 04/01/18 0941  AST 33  ALT 30  ALKPHOS 97  BILITOT 0.6  PROT 7.3  ALBUMIN 3.1*   No results for input(s): LIPASE, AMYLASE in the last 168 hours. No results for input(s): AMMONIA in the last 168 hours. CBC: Recent Labs  Lab 04/01/18 0941 04/02/18 0552 04/03/18 0538 04/03/18 2135 04/04/18 0548  WBC 21.2* 15.7* 7.5 9.2 8.7  NEUTROABS 20.0*  --   --   --   --   HGB 10.4* 8.2* 6.7* 11.3* 10.6*  HCT 33.4* 27.0* 22.3* 34.5* 32.5*  MCV 87.0 89.1 88.5 86.9 86.2  PLT 443* 393 352 364 332   Cardiac Enzymes: Recent Labs  Lab 04/01/18 0944  TROPONINI <0.03   BNP: Invalid input(s): POCBNP CBG: Recent Labs  Lab 04/03/18 0721 04/03/18 1122 04/03/18 1657 04/03/18 2115 04/04/18 0734  GLUCAP 83 101* 74 87 73   D-Dimer No results for input(s): DDIMER in the last 72 hours. Hgb A1c No results for input(s): HGBA1C in the last 72 hours. Lipid Profile No results for input(s): CHOL, HDL, LDLCALC, TRIG, CHOLHDL, LDLDIRECT in the last 72 hours. Thyroid function studies No results for input(s): TSH, T4TOTAL, T3FREE, THYROIDAB in the last 72 hours.  Invalid  input(s): FREET3 Anemia work up No results for input(s): VITAMINB12,  FOLATE, FERRITIN, TIBC, IRON, RETICCTPCT in the last 72 hours. Urinalysis    Component Value Date/Time   COLORURINE YELLOW 02/15/2017 1910   APPEARANCEUR CLEAR 02/15/2017 1910   LABSPEC 1.025 02/15/2017 1910   PHURINE 5.0 02/15/2017 1910   GLUCOSEU NEGATIVE 02/15/2017 1910   HGBUR NEGATIVE 02/15/2017 1910   BILIRUBINUR NEGATIVE 02/15/2017 1910   KETONESUR 20 (A) 02/15/2017 1910   PROTEINUR NEGATIVE 02/15/2017 1910   NITRITE NEGATIVE 02/15/2017 1910   LEUKOCYTESUR NEGATIVE 02/15/2017 1910   Sepsis Labs Invalid input(s): PROCALCITONIN,  WBC,  LACTICIDVEN Microbiology Recent Results (from the past 240 hour(s))  Blood Culture (routine x 2)     Status: None (Preliminary result)   Collection Time: 04/01/18  9:43 AM  Result Value Ref Range Status   Specimen Description BLOOD RIGHT WRIST  Final   Special Requests   Final    BOTTLES DRAWN AEROBIC AND ANAEROBIC Blood Culture adequate volume   Culture   Final    NO GROWTH 3 DAYS Performed at [facility], [address], [place], [place] [address]    Report Status PENDING  Incomplete  Blood Culture (routine x 2)     Status: None (Preliminary result)   Collection Time: 04/01/18  9:44 AM  Result Value Ref Range Status   Specimen Description RIGHT ANTECUBITAL  Final   Special Requests   Final    BOTTLES DRAWN AEROBIC AND ANAEROBIC Blood Culture adequate volume   Culture   Final    NO GROWTH 3 DAYS Performed at [facility], [address], [place], [place] [address]    Report Status PENDING  Incomplete  MRSA PCR Screening     Status: Abnormal   Collection Time: 04/01/18  1:10 PM  Result Value Ref Range Status   MRSA by PCR POSITIVE (A) NEGATIVE Final    Comment: RESULT CALLED TO, READ BACK BY AND VERIFIED WITH: HANDY,T @ 2117 ON 7.28.19 BY BOWMAN,L        The GeneXpert MRSA Assay (FDA approved for NASAL specimens only), is one component of a comprehensive MRSA  colonization surveillance program. It is not intended to diagnose MRSA infection nor to guide or monitor treatment for MRSA infections. Performed at [facility], [address] [place], [place] [address]      Time coordinating discharge: 40 minutes  SIGNED:   Erick Blinks, DO [facility] 04/04/2018, 9:50 AM Pager [phone]  If 7PM-7AM, please contact night-coverage [email or web address] Password [email or web address]

## 2018-04-04 NOTE — Plan of Care (Signed)
  Problem: Skin Integrity: Goal: Risk for impaired skin integrity will decrease Outcome: Adequate for Discharge    Redness and drainage to PEG site - gauze changed to keep skin dry and intact

## 2018-04-04 NOTE — Progress Notes (Signed)
[facility].  [facility] called and [facility] to [facility].

## 2018-04-04 NOTE — Progress Notes (Signed)
When giving meds and feeding through tube, noted that gauze around PEG was damp with drainage.  Gauze removed  - green drainage noted to be draining from site and insertion site slightly red as well.  Site cleansed with sterile water and new gauze applied. MD notified via text page.

## 2018-04-04 NOTE — Clinical Social Work Note (Signed)
Facility notified of discharge. Discharge clinicals sent to facility. Brother, Francee PiccoloDerek, notified of discharge. EMS transport arranged for 12:30 p.m.    LCSW signing off.     Halcyon Heck, Juleen ChinaHeather D, LCSW

## 2018-04-06 ENCOUNTER — Emergency Department (HOSPITAL_COMMUNITY): Payer: Medicaid Other

## 2018-04-06 ENCOUNTER — Emergency Department (HOSPITAL_COMMUNITY)
Admission: EM | Admit: 2018-04-06 | Discharge: 2018-04-06 | Disposition: A | Discharge status: Skilled Nursing Facility | Payer: Medicaid Other | Attending: Emergency Medicine | Admitting: Emergency Medicine

## 2018-04-06 ENCOUNTER — Other Ambulatory Visit: Payer: Self-pay

## 2018-04-06 ENCOUNTER — Encounter (HOSPITAL_COMMUNITY): Payer: Self-pay | Admitting: Emergency Medicine

## 2018-04-06 DIAGNOSIS — I110 Hypertensive heart disease with heart failure: Secondary | ICD-10-CM | POA: Diagnosis not present

## 2018-04-06 DIAGNOSIS — E119 Type 2 diabetes mellitus without complications: Secondary | ICD-10-CM | POA: Insufficient documentation

## 2018-04-06 DIAGNOSIS — I509 Heart failure, unspecified: Secondary | ICD-10-CM | POA: Insufficient documentation

## 2018-04-06 DIAGNOSIS — Z431 Encounter for attention to gastrostomy: Secondary | ICD-10-CM | POA: Insufficient documentation

## 2018-04-06 DIAGNOSIS — K9423 Gastrostomy malfunction: Secondary | ICD-10-CM

## 2018-04-06 DIAGNOSIS — Z978 Presence of other specified devices: Secondary | ICD-10-CM

## 2018-04-06 LAB — COMPREHENSIVE METABOLIC PANEL WITH GFR
ALT: 21 U/L (ref 0–44)
AST: 18 U/L (ref 15–41)
Albumin: 2.7 g/dL — ABNORMAL LOW (ref 3.5–5.0)
Alkaline Phosphatase: 68 U/L (ref 38–126)
Anion gap: 7 (ref 5–15)
BUN: 21 mg/dL — ABNORMAL HIGH (ref 6–20)
CO2: 28 mmol/L (ref 22–32)
Calcium: 8.3 mg/dL — ABNORMAL LOW (ref 8.9–10.3)
Chloride: 99 mmol/L (ref 98–111)
Creatinine, Ser: 0.93 mg/dL (ref 0.61–1.24)
GFR calc Af Amer: >60 mL/min
GFR calc non Af Amer: >60 mL/min
Glucose, Bld: 115 mg/dL — ABNORMAL HIGH (ref 70–99)
Potassium: 3.3 mmol/L — ABNORMAL LOW (ref 3.5–5.1)
Sodium: 134 mmol/L — ABNORMAL LOW (ref 135–145)
Total Bilirubin: 0.6 mg/dL (ref 0.3–1.2)
Total Protein: 6 g/dL — ABNORMAL LOW (ref 6.5–8.1)

## 2018-04-06 LAB — CBC
HCT: 33 % — ABNORMAL LOW (ref 39.0–52.0)
HEMOGLOBIN: 10.6 g/dL — AB (ref 13.0–17.0)
MCH: 28 pg (ref 26.0–34.0)
MCHC: 32.1 g/dL (ref 30.0–36.0)
MCV: 87.1 fL (ref 78.0–100.0)
Platelets: 312 10*3/uL (ref 150–400)
RBC: 3.79 MIL/uL — ABNORMAL LOW (ref 4.22–5.81)
RDW: 14.6 % (ref 11.5–15.5)
WBC: 6.6 10*3/uL (ref 4.0–10.5)

## 2018-04-06 LAB — CULTURE, BLOOD (ROUTINE X 2)
Culture: NO GROWTH
Culture: NO GROWTH
Special Requests: ADEQUATE
Special Requests: ADEQUATE

## 2018-04-06 MED ORDER — BACITRACIN-NEOMYCIN-POLYMYXIN 400-5-5000 EX OINT
TOPICAL_OINTMENT | CUTANEOUS | Status: AC
  Administered 2018-04-06: 1
  Filled 2018-04-06: qty 1

## 2018-04-06 MED ORDER — DIATRIZOATE MEGLUMINE & SODIUM 66-10 % PO SOLN
30.0000 mL | Freq: Once | ORAL | Status: DC
  Filled 2018-04-06: qty 30

## 2018-04-06 MED ORDER — DIATRIZOATE MEGLUMINE & SODIUM 66-10 % PO SOLN
ORAL | Status: AC
  Filled 2018-04-06: qty 30

## 2018-04-06 NOTE — Discharge Instructions (Addendum)
You were evaluated in the Emergency Department and after careful evaluation, we did not find any emergent condition requiring admission or further testing in the hospital.  We replaced your feeding tube today and then did a test to confirm its placement in the stomach.  Please return to the emergency department with any significant issues with feeding.  Please return to the Emergency Department if you experience any worsening of your condition.  We encourage you to follow up with a primary care provider.  Thank you for allowing us to be a part of your care.

## 2018-04-06 NOTE — ED Triage Notes (Signed)
Pt having abd pain when tube feeding is given through feeding tube

## 2018-04-06 NOTE — ED Notes (Signed)
[facility], stated there are trucks available and would be here ASAP.

## 2018-04-06 NOTE — ED Provider Notes (Addendum)
[facility] Emergency Department Provider Note MRN:  [id number]  Arrival date & time: 04/06/18     Chief Complaint   GI Problem   History of Present Illness   Ronald Ortiz is a 57 y.o. year-old male with a history of stroke, failure to thrive presenting to the ED with chief complaint of feeding tube issue.  Patient explains that he has been experiencing pain during tube feeds for the past 1 to 2 days.  Also thinks that the wrong sized feeding tube has been putting.  Denies fevers.  I was unable to obtain an accurate HPI, PMH, or ROS due to the patient's cognitive disability.   Review of Systems  Positive for feeding tube issue.  Patient's Health History    Past Medical History:  Diagnosis Date  . CHF (congestive heart failure) (HCC)   . Diabetes mellitus without complication (HCC)   . Dysphagia   . Hypercholesteremia   . Hypertension   . Renal disorder   . Stroke [facility](HCC)    feb 14th, 2018; residual left sided weakness    Past Surgical History:  Procedure Laterality Date  . PEG TUBE PLACEMENT      Family History  Problem Relation Age of Onset  . Hypertension Mother     Social History   Socioeconomic History  . Marital status: Divorced    Spouse name: Not on file  . Number of children: Not on file  . Years of education: Not on file  . Highest education level: Not on file  Occupational History  . Not on file  Social Needs  . Financial resource strain: Not on file  . Food insecurity:    Worry: Not on file    Inability: Not on file  . Transportation needs:    Medical: Not on file    Non-medical: Not on file  Tobacco Use  . Smoking status: Never Smoker  . Smokeless tobacco: Never Used  Substance and Sexual Activity  . Alcohol use: No  . Drug use: No  . Sexual activity: Not on file  Lifestyle  . Physical activity:    Days per week: Not on file    Minutes per session: Not on file  . Stress: Not on file  Relationships  . Social  connections:    Talks on phone: Not on file    Gets together: Not on file    Attends religious service: Not on file    Active member of club or organization: Not on file    Attends meetings of clubs or organizations: Not on file    Relationship status: Not on file  . Intimate partner violence:    Fear of current or ex partner: Not on file    Emotionally abused: Not on file    Physically abused: Not on file    Forced sexual activity: Not on file  Other Topics Concern  . Not on file  Social History Narrative  . Not on file     Physical Exam  Vital Signs and Nursing Notes reviewed Vitals:   04/06/18 1614  BP: 122/86  Pulse: 90  Resp: 18  Temp: 98 F (36.7 C)  SpO2: 100%    CONSTITUTIONAL: Chronically ill-appearing, NAD NEURO:  Alert and oriented x 3, no focal deficits EYES:  eyes equal and reactive ENT/NECK:  no LAD, no JVD CARDIO: Regular rate, well-perfused, normal S1 and S2 PULM:  CTAB no wheezing or rhonchi GI/GU:  normal bowel sounds, non-distended, non-tender; mild  erythema surrounding G-tube site, mild tenderness to palpation MSK/SPINE:  No gross deformities, no edema SKIN:  no rash, scattered healing abrasions to the face PSYCH:  Appropriate speech and behavior  Diagnostic and Interventional Summary    EKG Interpretation  Date/Time:    Ventricular Rate:    PR Interval:    QRS Duration:   QT Interval:    QTC Calculation:   R Axis:     Text Interpretation:        Labs Reviewed  CBC - Abnormal; Notable for the following components:      Result Value   RBC 3.79 (*)    Hemoglobin 10.6 (*)    HCT 33.0 (*)    All other components within normal limits  COMPREHENSIVE METABOLIC PANEL - Abnormal; Notable for the following components:   Sodium 134 (*)    Potassium 3.3 (*)    Glucose, Bld 115 (*)    BUN 21 (*)    Calcium 8.3 (*)    Total Protein 6.0 (*)    Albumin 2.7 (*)    All other components within normal limits    DG ABDOMEN PEG TUBE LOCATION    Final Result    DG Abd 2 Views  Final Result      Medications  diatrizoate meglumine-sodium (GASTROGRAFIN) 66-10 % solution (has no administration in time range)  diatrizoate meglumine-sodium (GASTROGRAFIN) 66-10 % solution 30 mL (has no administration in time range)     FEEDING TUBE REPLACEMENT Date/Time: 04/06/2018 8:06 PM Performed by: Sabas Sous, MD Authorized by: Sabas Sous, MD  Consent: Verbal consent obtained. Risks and benefits: risks, benefits and alternatives were discussed Consent given by: patient Patient understanding: patient states understanding of the procedure being performed Patient identity confirmed: verbally with patient and arm band Indications: tube malfunction Local anesthesia used: no  Anesthesia: Local anesthesia used: no  Sedation: Patient sedated: no  Tube type: gastrostomy Patient position: supine Procedure type: replacement Tube size: 18 Fr Endoscope used: no Bulb inflation volume: 5 (ml) Placement/position confirmation: gastric contents aspirated and contrast Tube placement difficulty: none Patient tolerance: Patient tolerated the procedure well with no immediate complications       Critical Care  ED Course and Medical Decision Making  I have reviewed the triage vital signs and the nursing notes.  Pertinent labs & imaging results that were available during my care of the patient were reviewed by me and considered in my medical decision making (see below for details). Clinical Course as of Apr 06 2000  Fri Apr 06, 2018  4480 57 year old male history of stroke, cognitive delay, failure to thrive here with pain during feeds through his feeding tube.  Patient cannot tell me when or if the tube was exchanged recently.  Has been there for years.  Recent admission for aspiration pneumonia, with notes commenting on concern for cellulitis surrounding G-tube placement.  Patient currently on Augmentin.  Patient denies recent pulling or  accidental removal of the feeding tube.  States that the feeding tube is too long.  Unable to speak with caregivers at facility, will try again.  Will obtain screening labs, 2 view x-ray contrast study to evaluate for proper placement of the current feeding tube.   [MB]  1855 Initial study to evaluate for possible false track of feeding tube was done without contrast, making it a unsatisfactory test.  The feeding tube was replaced with an 18 French PEG.  We will discuss with radiology need for contrasted study to  ensure proper placement.   [MB]  1856 The replacement tube went in with no issue, with gastric contents easily pulled back.   [MB]    Clinical Course User Index [MB] Sabas Sous, MD    Contrast study reveals gastric placement.  Will return with significant feeding issues.  Will continue home abx given small degree of surrounding erythema.  After the discussed management above, the patient was determined to be safe for discharge.  The patient was in agreement with this plan and all questions regarding their care were answered.  ED return precautions were discussed and the patient will return to the ED with any significant worsening of condition.  Elmer Sow. Pilar Plate, MD [facility] [email or web address]  Final Clinical Impressions(s) / ED Diagnoses     ICD-10-CM   1. PEG tube malfunction (HCC) K94.23 DG Abd 2 Views    DG Abd 2 Views  2. Uses feeding tube Z97.8 DG ABDOMEN PEG TUBE LOCATION    DG ABDOMEN PEG TUBE LOCATION    CANCELED: DG Abd 2 Views    CANCELED: DG Abd 2 Views    ED Discharge Orders    None         Sabas Sous, MD 04/06/18 2004    Sabas Sous, MD 04/06/18 2009

## 2018-04-07 ENCOUNTER — Encounter (HOSPITAL_COMMUNITY): Payer: Self-pay | Admitting: Emergency Medicine

## 2018-04-07 ENCOUNTER — Other Ambulatory Visit: Payer: Self-pay

## 2018-04-07 ENCOUNTER — Emergency Department (HOSPITAL_COMMUNITY): Payer: Medicaid Other

## 2018-04-07 ENCOUNTER — Emergency Department (HOSPITAL_COMMUNITY)
Admission: EM | Admit: 2018-04-07 | Discharge: 2018-04-07 | Disposition: A | Discharge status: Skilled Nursing Facility | Payer: Medicaid Other | Attending: Emergency Medicine | Admitting: Emergency Medicine

## 2018-04-07 DIAGNOSIS — E119 Type 2 diabetes mellitus without complications: Secondary | ICD-10-CM | POA: Insufficient documentation

## 2018-04-07 DIAGNOSIS — Z7902 Long term (current) use of antithrombotics/antiplatelets: Secondary | ICD-10-CM | POA: Diagnosis not present

## 2018-04-07 DIAGNOSIS — I509 Heart failure, unspecified: Secondary | ICD-10-CM | POA: Diagnosis not present

## 2018-04-07 DIAGNOSIS — Z79899 Other long term (current) drug therapy: Secondary | ICD-10-CM | POA: Insufficient documentation

## 2018-04-07 DIAGNOSIS — Z789 Other specified health status: Secondary | ICD-10-CM

## 2018-04-07 DIAGNOSIS — K9423 Gastrostomy malfunction: Secondary | ICD-10-CM | POA: Insufficient documentation

## 2018-04-07 DIAGNOSIS — I110 Hypertensive heart disease with heart failure: Secondary | ICD-10-CM | POA: Insufficient documentation

## 2018-04-07 MED ORDER — DIATRIZOATE MEGLUMINE & SODIUM 66-10 % PO SOLN
ORAL | Status: AC
  Filled 2018-04-07: qty 30

## 2018-04-07 NOTE — ED Provider Notes (Signed)
[facility] EMERGENCY DEPARTMENT Provider Note   CSN: [id number] Arrival date & time: 04/07/18  0500     History   Chief Complaint Chief Complaint  Patient presents with  . G tube replacement    HPI Ronald Ortiz is a 57 y.o. male.  The history is provided by the nursing home. The history is limited by the condition of the patient (Dementia).  He has history of hypertension, hyperlipidemia, diabetes, stroke, failure to thrive and comes in because of a feeding tube issue.  He had been in the ED last night and had a feeding tube placed.  Is reported to have pulled it out.    Past Medical History:  Diagnosis Date  . CHF (congestive heart failure) (HCC)   . Diabetes mellitus without complication (HCC)   . Dysphagia   . Hypercholesteremia   . Hypertension   . Renal disorder   . Stroke [name]    feb 14th, 2018; residual left sided weakness    Patient Active Problem List   Diagnosis Date Noted  . Protein-calorie malnutrition, severe 04/03/2018  . Encounter for hospice care discussion   . DNR (do not resuscitate) discussion   . Palliative care by specialist   . Aspiration pneumonia (HCC) 04/01/2018  . ARF (acute renal failure) with tubular necrosis (HCC) 04/01/2018  . Goals of care, counseling/discussion   . Palliative care encounter   . Failure to thrive in adult 02/15/2017  . Hypertension 02/15/2017  . Diabetes mellitus without complication (HCC) 02/15/2017  . Hypercholesteremia 02/15/2017  . Stroke (HCC) 02/15/2017  . Volume depletion 02/15/2017    Past Surgical History:  Procedure Laterality Date  . PEG TUBE PLACEMENT          Home Medications    Prior to Admission medications   Medication Sig Start Date End Date Taking? Authorizing Provider  amLODipine (NORVASC) 10 MG tablet Place 10 mg into feeding tube daily.    [provider]  amoxicillin-clavulanate (AUGMENTIN ES-600) 600-42.9 MG/5ML suspension Place 5 mLs (600 mg total) into feeding tube 2  (two) times daily for 4 days. 04/04/18 04/08/18  Sherryll Burger, Pratik D, DO  atorvastatin (LIPITOR) 10 MG tablet Place 10 mg into feeding tube at bedtime.    [provider]  clopidogrel (PLAVIX) 75 MG tablet Place 75 mg into feeding tube daily.    [provider]  erythromycin ophthalmic ointment Place 1 application into the right eye 2 (two) times daily.    [provider]  ferrous sulfate 220 (44 Fe) MG/5ML solution Take 308 mg by mouth 3 (three) times daily with meals. Give 7 mls with meals.    [provider]  fluticasone (FLONASE) 50 MCG/ACT nasal spray Place 1 spray into both nostrils daily.    [provider]  hydrOXYzine (ATARAX/VISTARIL) 25 MG tablet Place 25 mg into feeding tube every 8 (eight) hours as needed for itching.    [provider]  labetalol (NORMODYNE) 200 MG tablet Place 200 mg into feeding tube 2 (two) times daily.     [provider]  Multiple Vitamin (MULTIVITAMIN) LIQD Take 5 mLs by mouth daily.    [provider]  omeprazole (PRILOSEC) 20 MG capsule Take 20 mg by mouth daily.    [provider]  polymixin-bacitracin (POLYSPORIN) 500-10000 UNIT/GM OINT ointment Apply 1 application topically 2 (two) times daily. 04/04/18   Sherryll Burger, Pratik D, DO  sertraline (ZOLOFT) 100 MG tablet Place 100 mg into feeding tube daily.  [provider]  vitamin C (ASCORBIC ACID) 500 MG tablet Place 500 mg into feeding tube 2 (two) times daily.    [provider]  white petrolatum ointment Apply 1 application topically daily. Apply to lips    [provider]  zinc sulfate 220 (50 Zn) MG capsule Place 220 mg into feeding tube daily.    [provider]    Family History Family History  Problem Relation Age of Onset  . Hypertension Mother     Social History Social History   Tobacco Use  . Smoking status: Never Smoker  . Smokeless tobacco: Never Used  Substance Use Topics  . Alcohol  use: No  . Drug use: No     Allergies   Patient has no known allergies.   Review of Systems Review of Systems  Unable to perform ROS: Dementia     Physical Exam Updated Vital Signs BP 107/80 (BP Location: Left Arm)   Pulse 73   Temp 97.9 F (36.6 C) (Oral)   Resp 14   Ht 6\' 1"  (1.854 m)   Wt 57.6 kg (127 lb)   SpO2 100%   BMI 16.76 kg/m   Physical Exam  Nursing note and vitals reviewed.  57 year old male, resting comfortably and in no acute distress. Vital signs are normal. Oxygen saturation is 100%, which is normal. Head is normocephalic and atraumatic. PERRLA, EOMI. Oropharynx is clear. Neck is nontender and supple without adenopathy or JVD. Back is nontender and there is no CVA tenderness. Lungs are clear without rales, wheezes, or rhonchi. Chest is nontender. Heart has regular rate and rhythm without murmur. Abdomen is soft, flat, nontender without masses or hepatosplenomegaly and peristalsis is normoactive.  PEG tube is present in the left upper abdomen. Extremities have no cyanosis or edema, full range of motion is present. Skin is warm and dry without rash. Neurologic: Awake and alert, cranial nerves are intact, there are no focal motor or sensory deficits.  ED Treatments / Results   Radiology Dg Abdomen 1 View  Result Date: 04/07/2018 CLINICAL DATA:  Patient pulled OG tube that was placed earlier today. EXAM: ABDOMEN - 1 VIEW COMPARISON:  Radiograph yesterday. FINDINGS: Single AP supine view obtained after installation of 30 cc Gastrografin through indwelling gastrostomy tube. Contrast opacifies the gastrostomy tubing stomach without evidence of extravasation or leak. Contrast from gastrostomy tube evaluation yesterday is within the colon. No obstruction. IMPRESSION: Enteric contrast opacifies the stomach stomach confirming gastrostomy tube placement without evidence of extravasation or leak. Electronically Signed   By: Rubye OaksMelanie  Ehinger M.D.   On: 04/07/2018  06:20   Procedures Procedures   Medications Ordered in ED Medications - No data to display   Initial Impression / Assessment and Plan / ED Course  I have reviewed the triage vital signs and the nursing notes.  Pertinent imaging results that were available during my care of the patient were reviewed by me and considered in my medical decision making (see chart for details).  Old records are reviewed confirming ED visit for PEG tube replacement last night.  Patient arrived with tube in place, but it was noted that the balloon was deflated.  Balloon was reinflated and he is sent for x-ray to confirm position.  X-ray confirms appropriate position of feeding tube.  He is discharged back to his skilled care facility.  They are advised to make sure the balloon stays inflated, but they may resume routine tube feedings.  Final Clinical Impressions(s) /  ED Diagnoses   Final diagnoses:  PEG tube malfunction [facility])    ED Discharge Orders    None       Dione Booze, MD 04/07/18 [phone]

## 2018-04-07 NOTE — ED Notes (Signed)
Report given to Katha CabalPat Rhodes, [facility]. I called EMS and [facility] in route to pick up patient.

## 2018-04-07 NOTE — ED Notes (Signed)
EMS called for transport.

## 2018-04-07 NOTE — ED Triage Notes (Signed)
[facility]. Pt pulled out his G tube that was placed earlier today. G tube is in place upon arrival.

## 2018-04-07 NOTE — ED Notes (Signed)
C-com was called again at this time and they stated [facility]

## 2018-04-07 NOTE — Discharge Instructions (Addendum)
Keep the balloon inflated.  Resume tube feedings.

## 2019-03-06 DEATH — deceased

## 2020-03-23 IMAGING — DX DG ABDOMEN 1V
1 series · 1 of 1 positions shown · non-contrast
Comparison: Radiograph yesterday.

CLINICAL DATA: Patient pulled OG tube that was placed earlier
today.

EXAM:
ABDOMEN - 1 VIEW

[abdomen kub]
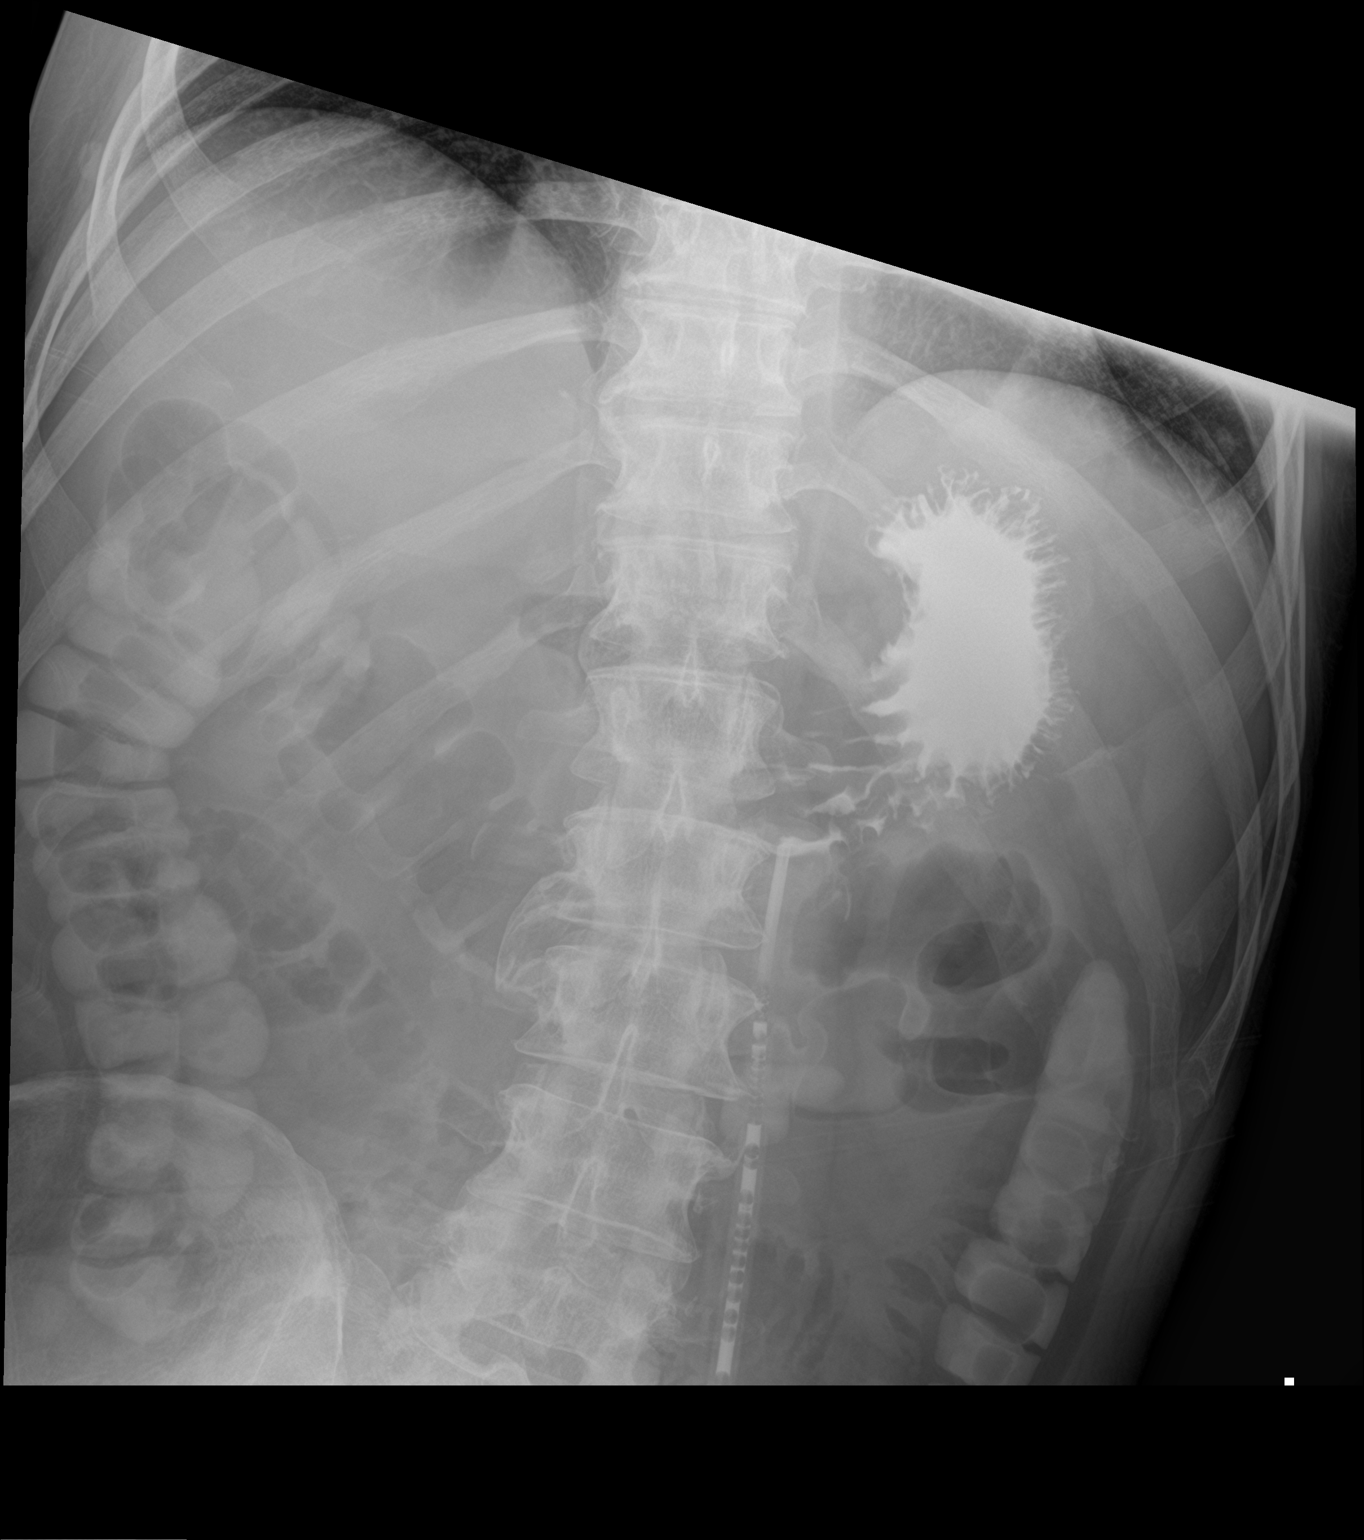

[1 of 1 positions shown; findings below may reference images not displayed]

FINDINGS: Single AP supine view obtained after installation of 30 cc
Gastrografin through indwelling gastrostomy tube. Contrast opacifies
the gastrostomy tubing stomach without evidence of extravasation or
leak. Contrast from gastrostomy tube evaluation yesterday is within
the colon. No obstruction.
IMPRESSION: Enteric contrast opacifies the stomach stomach confirming
gastrostomy tube placement without evidence of extravasation or
leak.
